# Patient Record
Sex: Female | Born: 1990 | Hispanic: Yes | Marital: Single | Smoking: Never smoker
Health system: Southern US, Community
[De-identification: ages and names within clinical notes are randomized; demographics above are authoritative.]

## PROBLEM LIST (undated history)

## (undated) DIAGNOSIS — G43909 Migraine, unspecified, not intractable, without status migrainosus: Secondary | ICD-10-CM

## (undated) DIAGNOSIS — E162 Hypoglycemia, unspecified: Secondary | ICD-10-CM

## (undated) HISTORY — PX: DENTAL SURGERY: SHX609

---
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
## 2014-01-05 ENCOUNTER — Emergency Department: Payer: Self-pay | Admitting: Emergency Medicine

## 2016-03-07 IMAGING — CR DG FOREARM 2V*L*
1 series · 1 of 1 positions shown · non-contrast
Comparison: None.

CLINICAL DATA: Dog bite.

EXAM:
LEFT FOREARM - 2 VIEW

[ap]
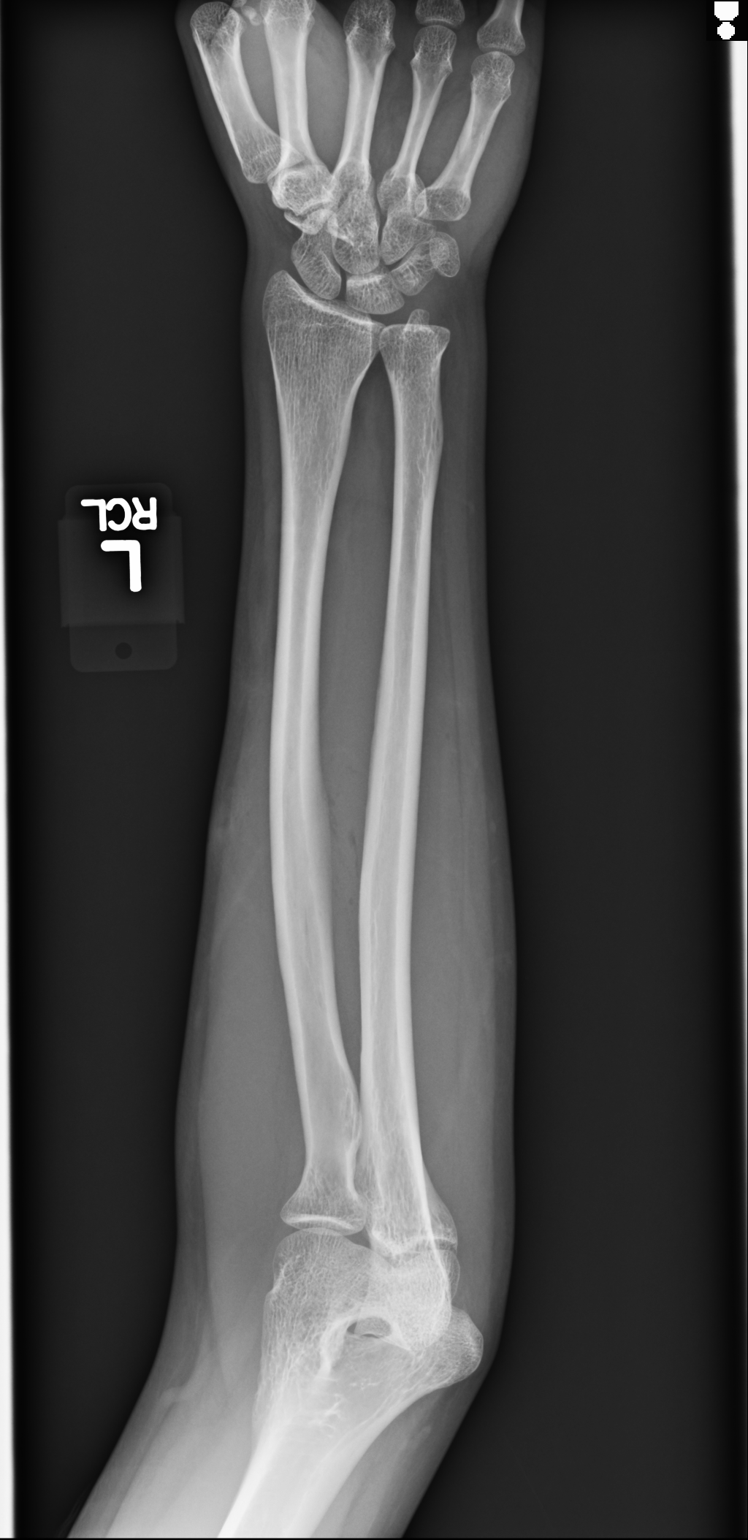

[1 of 1 positions shown; findings below may reference images not displayed]

FINDINGS: Small locules of air are seen in the soft tissues of the mid
forearm. No acute osseous abnormality or radiopaque foreign body.
IMPRESSION: Small amount of subcutaneous emphysema in the mid forearm without
acute osseous abnormality or radiopaque foreign body.

## 2018-11-27 ENCOUNTER — Other Ambulatory Visit: Payer: Self-pay

## 2018-11-27 ENCOUNTER — Encounter: Payer: Self-pay | Admitting: Emergency Medicine

## 2018-11-27 ENCOUNTER — Ambulatory Visit
Admission: EM | Admit: 2018-11-27 | Discharge: 2018-11-27 | Disposition: A | Discharge status: Home / Self Care | Payer: BC Managed Care – PPO | Attending: Family Medicine | Admitting: Family Medicine

## 2018-11-27 DIAGNOSIS — N3001 Acute cystitis with hematuria: Secondary | ICD-10-CM | POA: Diagnosis not present

## 2018-11-27 HISTORY — DX: Hypoglycemia, unspecified: E16.2

## 2018-11-27 HISTORY — DX: Migraine, unspecified, not intractable, without status migrainosus: G43.909

## 2018-11-27 LAB — URINALYSIS, COMPLETE (UACMP) WITH MICROSCOPIC
Bilirubin Urine: NEGATIVE
Glucose, UA: NEGATIVE mg/dL
Ketones, ur: NEGATIVE mg/dL
Nitrite: NEGATIVE
Protein, ur: NEGATIVE mg/dL
Specific Gravity, Urine: 1.02 (ref 1.005–1.030)
pH: 7 (ref 5.0–8.0)

## 2018-11-27 MED ORDER — CEPHALEXIN 500 MG PO CAPS: 500.0000 mg | ORAL_CAPSULE | Freq: Two times a day (BID) | ORAL | 0 refills | Status: DC

## 2018-11-27 NOTE — ED Triage Notes (Signed)
Patient in today c/o dysuria, abnormal odor and back pain x 3 days. No fever. No vaginal discharge.

## 2018-11-27 NOTE — Discharge Instructions (Signed)
Antibiotic as prescribed.  Take care  Dr. Lucinda Spells  

## 2018-11-27 NOTE — ED Provider Notes (Signed)
[facility]    CSN: [id number] Arrival date & time: 11/27/18  1724  History   Chief Complaint Chief Complaint  Patient presents with  . Dysuria   HPI  28 year old female presents with concerns for UTI.  Patient reports that her symptoms started 3 days ago.  She reports dysuria and urinary odor.  Associated suprapubic discomfort and back pain.  Reports urgency.  No fever.  No chills.  She states that she has had UTIs in the past.  No medications or interventions tried.  Denies vaginal discharge.  No known exacerbating or relieving factors.  No other complaints.  PMH, Surgical Hx, Family Hx, Social History reviewed and updated as below.  Past Medical History:  Diagnosis Date  . Hypoglycemia   . Migraine    Past Surgical History:  Procedure Laterality Date  . DENTAL SURGERY     OB History   No obstetric history on file.    Home Medications    Prior to Admission medications   Medication Sig Start Date End Date Taking? Authorizing Provider  cephALEXin (KEFLEX) 500 MG capsule Take 1 capsule (500 mg total) by mouth 2 (two) times daily. 11/27/18   Delle Andrzejewski G, DO  ESTARYLLA 0.25-35 MG-MCG tablet TK 1 T PO QD FOR BIRTH CONTROL. 08/25/18   [provider]    Family History Family History  Problem Relation Age of Onset  . Diabetes Mother   . Diabetes Father     Social History Social History   Tobacco Use  . Smoking status: Never Smoker  . Smokeless tobacco: Never Used  Substance Use Topics  . Alcohol use: Yes    Comment: social  . Drug use: Never     Allergies   Patient has no known allergies.   Review of Systems Review of Systems  Constitutional: Negative.   Gastrointestinal: Positive for abdominal pain.  Genitourinary: Positive for dysuria and urgency.  Musculoskeletal: Positive for back pain.   Physical Exam Triage Vital Signs ED Triage Vitals [11/27/18 1736]  Enc Vitals Group     BP 125/86     Pulse Rate 60     Resp 18   Temp 98.2 F (36.8 C)     Temp Source Oral     SpO2 100 %     Weight 130 lb (59 kg)     Height 5\' 2"  (1.575 m)     Head Circumference      Peak Flow      Pain Score 5     Pain Loc      Pain Edu?      Excl. in GC?    Updated Vital Signs BP 125/86 (BP Location: Left Arm)   Pulse 60   Temp 98.2 F (36.8 C) (Oral)   Resp 18   Ht 5\' 2"  (1.575 m)   Wt 59 kg   LMP 11/06/2018 (Approximate)   SpO2 100%   BMI 23.78 kg/m   Visual Acuity Right Eye Distance:   Left Eye Distance:   Bilateral Distance:    Right Eye Near:   Left Eye Near:    Bilateral Near:     Physical Exam Vitals signs and nursing note reviewed.  Constitutional:      General: She is not in acute distress.    Appearance: Normal appearance. She is not ill-appearing.  HENT:     Head: Normocephalic and atraumatic.  Eyes:     General:        Right eye: No  discharge.        Left eye: No discharge.     Conjunctiva/sclera: Conjunctivae normal.  Cardiovascular:     Rate and Rhythm: Normal rate and regular rhythm.     Heart sounds: No murmur.  Pulmonary:     Effort: Pulmonary effort is normal.     Breath sounds: Normal breath sounds. No wheezing, rhonchi or rales.  Abdominal:     General: There is no distension.     Palpations: Abdomen is soft.     Comments: Mild tenderness in the suprapubic region.  Neurological:     Mental Status: She is alert.  Psychiatric:        Mood and Affect: Mood normal.        Behavior: Behavior normal.    UC Treatments / Results  Labs (all labs ordered are listed, but only abnormal results are displayed) Labs Reviewed  URINALYSIS, COMPLETE (UACMP) WITH MICROSCOPIC - Abnormal; Notable for the following components:      Result Value   Hgb urine dipstick SMALL (*)    Leukocytes,Ua SMALL (*)    Bacteria, UA FEW (*)    All other components within normal limits  URINE CULTURE    EKG   Radiology No results found.  Procedures Procedures (including critical care time)   Medications Ordered in UC Medications - No data to display  Initial Impression / Assessment and Plan / UC Course  I have reviewed the triage vital signs and the nursing notes.  Pertinent labs & imaging results that were available during my care of the patient were reviewed by me and considered in my medical decision making (see chart for details).    28 year old female presents with urinary symptoms.  Urinalysis consistent with UTI.  Sending culture.  Placing on Keflex.  Final Clinical Impressions(s) / UC Diagnoses   Final diagnoses:  Acute cystitis with hematuria     Discharge Instructions     Antibiotic as prescribed.  Take care  Dr. Lacinda Axon    ED Prescriptions    Medication Sig Dispense Auth. Provider   cephALEXin (KEFLEX) 500 MG capsule Take 1 capsule (500 mg total) by mouth 2 (two) times daily. 14 capsule Thersa Salt G, DO     PDMP not reviewed this encounter.   Coral Spikes, [place] 11/27/18 1830

## 2018-11-29 LAB — URINE CULTURE: Culture: 40000 — AB

## 2018-12-01 ENCOUNTER — Telehealth (HOSPITAL_COMMUNITY): Payer: Self-pay | Admitting: Emergency Medicine

## 2018-12-01 NOTE — Telephone Encounter (Signed)
Urine culture was positive for ESCHERICHIA COLI  and was given keflex  at urgent care visit.Attempted to reach patient. No answer at this time.   

## 2019-03-20 ENCOUNTER — Other Ambulatory Visit: Payer: Self-pay

## 2019-03-20 ENCOUNTER — Encounter: Payer: Self-pay | Admitting: Emergency Medicine

## 2019-03-20 ENCOUNTER — Ambulatory Visit
Admission: EM | Admit: 2019-03-20 | Discharge: 2019-03-20 | Disposition: A | Discharge status: Home / Self Care | Payer: BC Managed Care – PPO | Attending: Urgent Care | Admitting: Urgent Care

## 2019-03-20 DIAGNOSIS — G43009 Migraine without aura, not intractable, without status migrainosus: Secondary | ICD-10-CM

## 2019-03-20 DIAGNOSIS — R519 Headache, unspecified: Secondary | ICD-10-CM

## 2019-03-20 MED ORDER — KETOROLAC TROMETHAMINE 60 MG/2ML IM SOLN
30.0000 mg | Freq: Once | INTRAMUSCULAR | Status: AC
Start: 2019-03-20 — End: 2019-03-20
  Administered 2019-03-20: 30 mg via INTRAMUSCULAR

## 2019-03-20 MED ORDER — DEXAMETHASONE SODIUM PHOSPHATE 10 MG/ML IJ SOLN
10.0000 mg | Freq: Once | INTRAMUSCULAR | Status: AC
  Administered 2019-03-20: 10 mg via INTRAMUSCULAR

## 2019-03-20 MED ORDER — BUTALBITAL-APAP-CAFFEINE 50-325-40 MG PO TABS: 1.0000 | ORAL_TABLET | Freq: Three times a day (TID) | ORAL | 0 refills | Status: DC | PRN

## 2019-03-20 NOTE — ED Triage Notes (Signed)
Patient describes her headache as on top of her head and behind her eyes that started today.  Patient reports history of migraines.  Patient reports light and sound sensitivity.  Patient took Tylenol today with no relief.

## 2019-03-20 NOTE — Discharge Instructions (Addendum)
It was very nice seeing you today in clinic. Thank you for entrusting me with your care.   Migraine likely associated with your menstrual cycle. Rest and make sure you are staying well hydrated. May use Tylenol and/or Ibuprofen as needed for headache. If pain not resolved, may use the prescribed intervention. Be careful as it can make you sleepy; no driving while taking this medication.   Make arrangements to follow up with your regular doctor in 1 week for re-evaluation if not improving. If your symptoms/condition worsens, please seek follow up care either here or in the ER. Please remember, our [facility] providers are "right here with you" when you need [place].   Again, it was my pleasure to take care of you today. Thank you for choosing our clinic. I hope that you start to feel better quickly.   Quentin Mulling, MSN, APRN, FNP-C, CEN Advanced Practice Provider  [facility]

## 2019-03-21 NOTE — ED Provider Notes (Signed)
[place], [place]   Name: Susan Hanna DOB: [birth year] MRN: [id number] CSN: [id number] PCP: [facility] [place] date and time:  03/20/19 1604  Chief Complaint:  Headache   NOTE: Prior to seeing the patient today, I have reviewed the triage nursing documentation and vital signs. Clinical staff has updated patient's PMH/PSHx, current medication list, and drug allergies/intolerances to ensure comprehensive history available to assist in medical decision making.   History:   HPI: Susan Hanna is a 29 y.o. female who presents today with complaints of a migraine headache that began with acute onset yesterday; worsened today. Patient has a PMH significant for migraines and notes that this headache is similar to her past episodes. She denies preceding aura or known triggers. No recent illnesses or upper respiratory symptoms. She has experienced associated nausea, vomiting, vertiginous symptoms, and photophobia. Last vomited yesterday, however nausea persists. Patient reporting a decreased appetite secondary to her symptoms, however she has been making a conscious effort to increase fluid intake in order to maintain adequate hydration. Patient denies focal weakness, nuchal rigidity, changes to her vision, AMS, ataxia, or difficulties with her speech. Patient advising today that she is not on any type of preventative or abortive therapies for the management of her migraine headaches. In efforts to conservatively manage her symptoms at home, the patient notes that she has used APAP, which has not helped to improve her symptoms.   Past Medical History:  Diagnosis Date  . Hypoglycemia   . Migraine     Past Surgical History:  Procedure Laterality Date  . DENTAL SURGERY      Family History  Problem Relation Age of Onset  . Diabetes Mother   . Diabetes Father     Social History   Tobacco Use  . Smoking status: Never Smoker  . Smokeless tobacco: Never  Used  Substance Use Topics  . Alcohol use: Yes    Comment: social  . Drug use: Never    There are no problems to display for this patient.   Home Medications:    Current Meds  Medication Sig  . ESTARYLLA 0.25-35 MG-MCG tablet TK 1 T PO QD FOR BIRTH CONTROL.    Allergies:   Patient has no known allergies.  Review of Systems (ROS): Review of Systems  Constitutional: Positive for appetite change. Negative for fatigue and fever.  HENT: Negative for congestion, ear pain, postnasal drip, rhinorrhea, sinus pressure, sinus pain, sneezing and sore throat.   Eyes: Positive for photophobia. Negative for pain, discharge, redness and visual disturbance.  Respiratory: Negative for cough, chest tightness and shortness of breath.   Cardiovascular: Negative for chest pain and palpitations.  Gastrointestinal: Positive for nausea and vomiting. Negative for abdominal pain and diarrhea.  Musculoskeletal: Negative for arthralgias, back pain, myalgias and neck pain.  Skin: Negative for color change, pallor and rash.  Neurological: Positive for dizziness and headaches. Negative for syncope and weakness.  Hematological: Negative for adenopathy.     Vital Signs: Today's Vitals   03/20/19 1628 03/20/19 1631 03/20/19 1730  BP:  124/83   Pulse:  63   Resp:  14   Temp:  98.4 F (36.9 C)   TempSrc:  Oral   SpO2:  100%   Weight: 133 lb (60.3 kg)    Height: 5\' 2"  (1.575 m)    PainSc: 9   9     Physical Exam: Physical Exam  Constitutional: She is oriented to person, place, and time and  well-developed, well-nourished, and in no distress.  HENT:  Head: Normocephalic and atraumatic.  Right Ear: Tympanic membrane normal.  Left Ear: Tympanic membrane normal.  Nose: Nose normal.  Mouth/Throat: Uvula is midline, oropharynx is clear and moist and mucous membranes are normal.  Eyes: Pupils are equal, round, and reactive to light. Conjunctivae and EOM are normal.  Neck: No Brudzinski's sign and no  Kernig's sign noted.  Cardiovascular: Normal rate, regular rhythm, normal heart sounds and intact distal pulses.  Pulmonary/Chest: Effort normal and breath sounds normal.  Musculoskeletal:        General: Normal range of motion.     Cervical back: Full passive range of motion without pain and neck supple.  Neurological: She is alert and oriented to person, place, and time. She has normal sensation, normal strength, normal reflexes and intact cranial nerves. Gait normal.  Skin: Skin is warm and dry. No rash noted. She is not diaphoretic.  Psychiatric: Mood, memory, affect and judgment normal.  Nursing note and vitals reviewed.   Urgent Care Treatments / Results:   No orders of the defined types were placed in this encounter.   LABS: PLEASE NOTE: all labs that were ordered this encounter are listed, however only abnormal results are displayed. Labs Reviewed - No data to display  EKG: -None  RADIOLOGY: No results found.  PROCEDURES: Procedures  MEDICATIONS RECEIVED THIS VISIT: Medications  ketorolac (TORADOL) injection 30 mg (30 mg Intramuscular Given 03/20/19 1658)  dexamethasone (DECADRON) injection 10 mg (10 mg Intramuscular Given 03/20/19 1658)    PERTINENT CLINICAL COURSE NOTES/UPDATES:   Initial Impression / Assessment and Plan / Urgent Care Course:  Pertinent labs & imaging results that were available during my care of the patient were personally reviewed by me and considered in my medical decision making (see lab/imaging section of note for values and interpretations).  Susan Hanna is a 29 y.o. female who presents to [facility] today with complaints of Headache  Patient is well appearing overall in clinic today. She does not appear to be in any acute distress. Presenting symptoms (see HPI) and exam as documented above. Symptoms consistent with classic migraine. Patient notes similar to previous headaches. Patient current on menstrual cycle, thus  etiology is favored to be hormonal. No focal neurological concerns. Nausea today, however she advises that symptoms controlled at time of clinic visit. Last vomited x 1 yesterday. Treated in clinic with IM ketorolac and dexamethasone, which improved her symptoms (pain down to 7/10). Patient advised that medications will continue to work over the next few hours. She feels well enough to proceed with discharge. Patient to use APAP and/or IBU as needed to recurrent headaches tonight. Will provide a short course of Fioricet 50/325/40 mg tablets for PRN use; indications and associated side effects reviewed. She does not feel like she needs an intervention in place for nausea at this point. Encouraged patient to return call to the clinic if not improving. Additionally, encouraged patient to discuss migraines with her PCP to determine if abortive therapy may be warranted for her.   Discussed follow up with primary care physician in 1 week, or sooner if needed, for re-evaluation. I have reviewed the follow up and strict return precautions for any new or worsening symptoms. Patient is aware of symptoms that would be deemed urgent/emergent, and would thus require further evaluation either here or in the emergency department. At the time of discharge, she verbalized understanding and consent with the discharge plan as it was  reviewed with her. All questions were fielded by provider and/or clinic staff prior to patient discharge.    Final Clinical Impressions / Urgent Care Diagnoses:   Final diagnoses:  Migraine without aura and without status migrainosus, not intractable    New Prescriptions:  [place] Controlled Substance Registry consulted? Not Applicable   . Discussed use of controlled substance medication to treat her acute pain.  o Reviewed  STOP Act regulations  o Clinic does not refill controlled substances over the phone without face to face evaluation.  . Safety precautions reviewed.  o Medications should  not be bitten, chewed, sold, or taken with alcohol.  o Avoid use while working, driving, or operating heavy machinery.  o Side effects associated with the use of this particular medication reviewed. - Patient understands that this medication can cause CNS depression, increase her risk of falls, and even lead to overdose that may result in death, if used outside of the parameters that she and I discussed.  With all of this in mind, she knowingly accepts the risks and responsibilities associated with intended course of treatment, and elects to responsibly proceed as discussed.  Meds ordered this encounter  Medications  . ketorolac (TORADOL) injection 30 mg  . dexamethasone (DECADRON) injection 10 mg  . butalbital-acetaminophen-caffeine (FIORICET) 50-325-40 MG tablet    Sig: Take 1 tablet by mouth every 8 (eight) hours as needed for headache.    Dispense:  15 tablet    Refill:  0    Recommended Follow up Care:  Patient encouraged to follow up with the following provider within the specified time frame, or sooner as dictated by the severity of her symptoms. As always, she was instructed that for any urgent/emergent care needs, she should seek care either here or in the emergency department for more immediate evaluation.  Follow-up Information    [facility] In 1 week.   Specialty: General Practice Why: General reassessment of symptoms if not improving Contact information: [address] [place] [address] [phone]         NOTE: This note was prepared using Dragon dictation software along with smaller phrase technology. Despite my best ability to proofread, there is the potential that transcriptional errors may still occur from this process, and are completely unintentional.    Verlee Monte, NP 03/21/19 2109

## 2019-09-27 ENCOUNTER — Ambulatory Visit
Admission: EM | Admit: 2019-09-27 | Discharge: 2019-09-27 | Disposition: A | Discharge status: Home / Self Care | Payer: BC Managed Care – PPO | Attending: Internal Medicine | Admitting: Internal Medicine

## 2019-09-27 ENCOUNTER — Other Ambulatory Visit: Payer: Self-pay

## 2019-09-27 ENCOUNTER — Encounter: Payer: Self-pay | Admitting: Emergency Medicine

## 2019-09-27 ENCOUNTER — Ambulatory Visit (INDEPENDENT_AMBULATORY_CARE_PROVIDER_SITE_OTHER): Payer: BC Managed Care – PPO

## 2019-09-27 DIAGNOSIS — S93602A Unspecified sprain of left foot, initial encounter: Secondary | ICD-10-CM

## 2019-09-27 MED ORDER — IBUPROFEN 600 MG PO TABS
600.0000 mg | ORAL_TABLET | Freq: Four times a day (QID) | ORAL | 0 refills | Status: DC | PRN
Start: 2019-09-27 — End: 2020-03-21

## 2019-09-27 NOTE — Discharge Instructions (Signed)
Gentle range of motion exercises Start bearing weight as tolerated Continue icing of the left foot. Return to urgent care if you have any worsening symptoms.

## 2019-09-27 NOTE — ED Triage Notes (Signed)
Patient c/o left ankle pain after rolling her ankle yesterday.  Patient states that she missed a step and fell.

## 2019-09-27 NOTE — ED Notes (Signed)
Ace wrap applied to right ankle.

## 2019-09-27 NOTE — ED Provider Notes (Signed)
[facility]    CSN: [id number] Arrival date & time: 09/27/19  0959      History   Chief Complaint Chief Complaint  Patient presents with  . Fall  . Ankle Pain    left    HPI Susan Hanna is a 29 y.o. female comes to the urgent care with complaints of left ankle pain which started after she rolled her ankle yesterday.  She was walking down the stairs when she rolled her ankle.  She has some swelling in the left foot near the ankle.  She has been icing it and has significant tenderness when she tries to bear weight.  No swelling of the ankle joint.  No bruising of the ankle.Marland Kitchen   HPI  Past Medical History:  Diagnosis Date  . Hypoglycemia   . Migraine     There are no problems to display for this patient.   Past Surgical History:  Procedure Laterality Date  . DENTAL SURGERY      OB History   No obstetric history on file.      Home Medications    Prior to Admission medications   Medication Sig Start Date End Date Taking? Authorizing Provider  ESTARYLLA 0.25-35 MG-MCG tablet TK 1 T PO QD FOR BIRTH CONTROL. 08/25/18  Yes [provider]  butalbital-acetaminophen-caffeine (FIORICET) 50-325-40 MG tablet Take 1 tablet by mouth every 8 (eight) hours as needed for headache. 03/20/19   Verlee Monte, NP  ibuprofen (ADVIL) 600 MG tablet Take 1 tablet (600 mg total) by mouth every 6 (six) hours as needed. 09/27/19   LampteyBritta Mccreedy, MD    Family History Family History  Problem Relation Age of Onset  . Diabetes Mother   . Diabetes Father     Social History Social History   Tobacco Use  . Smoking status: Never Smoker  . Smokeless tobacco: Never Used  Vaping Use  . Vaping Use: Never used  Substance Use Topics  . Alcohol use: Yes    Comment: social  . Drug use: Never     Allergies   Patient has no known allergies.   Review of Systems Review of Systems  HENT: Negative.   Respiratory: Negative.   Gastrointestinal: Negative.     Musculoskeletal: Positive for arthralgias and joint swelling.     Physical Exam Triage Vital Signs ED Triage Vitals  Enc Vitals Group     BP 09/27/19 1140 116/81     Pulse Rate 09/27/19 1140 75     Resp 09/27/19 1140 14     Temp 09/27/19 1140 98.6 F (37 C)     Temp Source 09/27/19 1140 Oral     SpO2 09/27/19 1140 99 %     Weight 09/27/19 1137 145 lb (65.8 kg)     Height 09/27/19 1137 5\' 2"  (1.575 m)     Head Circumference --      Peak Flow --      Pain Score 09/27/19 1137 8     Pain Loc --      Pain Edu? --      Excl. in GC? --    No data found.  Updated Vital Signs BP 116/81 (BP Location: Right Arm)   Pulse 75   Temp 98.6 F (37 C) (Oral)   Resp 14   Ht 5\' 2"  (1.575 m)   Wt 65.8 kg   LMP 09/06/2019 (Approximate)   SpO2 99%   BMI 26.52 kg/m   Visual Acuity Right  Eye Distance:   Left Eye Distance:   Bilateral Distance:    Right Eye Near:   Left Eye Near:    Bilateral Near:     Physical Exam Vitals and nursing note reviewed.  Constitutional:      Appearance: Normal appearance.  Cardiovascular:     Rate and Rhythm: Normal rate and regular rhythm.  Musculoskeletal:     Comments: Tenderness on palpation of the left first tarsal metatarsal joint.  Swelling with no bruising.  Skin:    General: Skin is warm and dry.  Neurological:     Mental Status: She is alert.      UC Treatments / Results  Labs (all labs ordered are listed, but only abnormal results are displayed) Labs Reviewed - No data to display  EKG   Radiology DG Ankle Complete Left  Result Date: 09/27/2019 CLINICAL DATA:  Ankle pain and swelling after fall. EXAM: LEFT ANKLE COMPLETE - 3+ VIEW COMPARISON:  None. FINDINGS: Normal anatomic alignment. No evidence for acute fracture or dislocation. Talar dome is intact. No significant soft tissue swelling. IMPRESSION: No acute osseous abnormality. Electronically Signed   By: Annia Belt M.D.   On: 09/27/2019 12:12    Procedures Procedures  (including critical care time)  Medications Ordered in UC Medications - No data to display  Initial Impression / Assessment and Plan / UC Course  I have reviewed the triage vital signs and the nursing notes.  Pertinent labs & imaging results that were available during my care of the patient were reviewed by me and considered in my medical decision making (see chart for details).     1.  Left foot sprain: X-ray of the left foot is negative for acute fracture Ankle brace Tylenol/Motrin as needed for pain Gentle range of motion exercises, continue icing the joint, elevation of the joint with no walking Work excuse given Return precautions given. Final Clinical Impressions(s) / UC Diagnoses   Final diagnoses:  Foot sprain, left, initial encounter     Discharge Instructions     Gentle range of motion exercises Start bearing weight as tolerated Continue icing of the left foot. Return to urgent care if you have any worsening symptoms.   ED Prescriptions    Medication Sig Dispense Auth. Provider   ibuprofen (ADVIL) 600 MG tablet Take 1 tablet (600 mg total) by mouth every 6 (six) hours as needed. 30 tablet Susan Hanna, Susan Mccreedy, MD     PDMP not reviewed this encounter.   Merrilee Jansky, MD 09/27/19 1300

## 2019-11-10 ENCOUNTER — Other Ambulatory Visit: Payer: Self-pay

## 2019-11-10 ENCOUNTER — Ambulatory Visit
Admission: EM | Admit: 2019-11-10 | Discharge: 2019-11-10 | Disposition: A | Discharge status: Home / Self Care | Payer: BC Managed Care – PPO | Attending: Emergency Medicine | Admitting: Emergency Medicine

## 2019-11-10 ENCOUNTER — Encounter: Payer: Self-pay | Admitting: Emergency Medicine

## 2019-11-10 DIAGNOSIS — R112 Nausea with vomiting, unspecified: Secondary | ICD-10-CM | POA: Insufficient documentation

## 2019-11-10 DIAGNOSIS — Z3201 Encounter for pregnancy test, result positive: Secondary | ICD-10-CM | POA: Insufficient documentation

## 2019-11-10 LAB — PREGNANCY, URINE: Preg Test, Ur: POSITIVE — AB

## 2019-11-10 MED ORDER — DOXYLAMINE SUCCINATE (SLEEP) 25 MG PO TABS: ORAL_TABLET | ORAL | 2 refills | Status: DC

## 2019-11-10 MED ORDER — VITAMIN B-6 25 MG PO TABS: 25.0000 mg | ORAL_TABLET | ORAL | 1 refills | Status: AC | PRN

## 2019-11-10 NOTE — Discharge Instructions (Addendum)
The pregnancy test is positive today.  Follow-up with your OB GYN.  If you need anything for headaches you can take Tylenol only.  I have prescribed 2 different medications for nausea and vomiting.  Try to eat smaller meals and stay hydrated.  For any severe vomiting that is causing you to be weak go to the emergency room.  Also start prenatal vitamin.

## 2019-11-10 NOTE — ED Provider Notes (Signed)
[facility]    CSN: [id number] Arrival date & time: 11/10/19  1855      History   Chief Complaint Chief Complaint  Patient presents with  . Emesis    HPI Susan Hanna is a 29 y.o. female presenting for 4-day history of vomiting and nausea.  She says that she has about 3 episodes of vomiting a day.  She said nothing really brings it on, she just feels like something is stuck in her throat and then she vomits.  She says she is able to hold food down.  Her biggest concern is that she could possibly be pregnant.  Her last menstrual period only lasted for a day and that was 4 days ago.  She says she normally has periods that last longer than that.  She did stop taking her oral contraceptive 2 weeks ago.  Patient denies fever, fatigue, abdominal pain, back pain, dysuria, hematuria, vaginal discharge, concern for STIs.  Patient denies known Covid exposure but has not been vaccinated.  She denies any cough, congestion, sore throat, breathing difficulty.  No other concerns today.  HPI  Past Medical History:  Diagnosis Date  . Hypoglycemia   . Migraine     There are no problems to display for this patient.   Past Surgical History:  Procedure Laterality Date  . DENTAL SURGERY      OB History   No obstetric history on file.      Home Medications    Prior to Admission medications   Medication Sig Start Date End Date Taking? Authorizing Provider  butalbital-acetaminophen-caffeine (FIORICET) 50-325-40 MG tablet Take 1 tablet by mouth every 8 (eight) hours as needed for headache. 03/20/19  Yes Verlee Monte, NP  doxylamine, Sleep, (UNISOM) 25 MG tablet Take 1/2 tab PO q6h prn nausea/vomiting pregnancy related 11/10/19   Eusebio Friendly B, PA-C  ESTARYLLA 0.25-35 MG-MCG tablet TK 1 T PO QD FOR BIRTH CONTROL. 08/25/18   [provider]  ibuprofen (ADVIL) 600 MG tablet Take 1 tablet (600 mg total) by mouth every 6 (six) hours as needed. 09/27/19   Lamptey, Britta Mccreedy, MD  vitamin B-6 (PYRIDOXINE) 25 MG tablet Take 1 tablet (25 mg total) by mouth every 4 (four) hours as needed (nausea/vomiting). 11/10/19 12/10/19  Shirlee Latch, PA-C    Family History Family History  Problem Relation Age of Onset  . Diabetes Mother   . Diabetes Father     Social History Social History   Tobacco Use  . Smoking status: Never Smoker  . Smokeless tobacco: Never Used  Vaping Use  . Vaping Use: Never used  Substance Use Topics  . Alcohol use: Yes    Comment: social  . Drug use: Never     Allergies   Patient has no known allergies.   Review of Systems Review of Systems  Constitutional: Negative for chills, diaphoresis, fatigue and fever.  HENT: Negative for congestion, ear pain, rhinorrhea, sinus pressure, sinus pain and sore throat.   Respiratory: Negative for cough and shortness of breath.   Gastrointestinal: Positive for nausea and vomiting. Negative for abdominal pain.  Genitourinary: Negative for difficulty urinating, dysuria, frequency, pelvic pain and vaginal discharge.  Musculoskeletal: Negative for arthralgias and myalgias.  Skin: Negative for rash.  Neurological: Positive for headaches. Negative for weakness.  Hematological: Negative for adenopathy.     Physical Exam Triage Vital Signs ED Triage Vitals  Enc Vitals Group     BP 11/10/19 1947 113/77  Pulse Rate 11/10/19 1947 70     Resp 11/10/19 1947 18     Temp 11/10/19 1947 98.6 F (37 C)     Temp Source 11/10/19 1947 Oral     SpO2 11/10/19 1947 100 %     Weight 11/10/19 1946 145 lb 1 oz (65.8 kg)     Height 11/10/19 1946 5\' 2"  (1.575 m)     Head Circumference --      Peak Flow --      Pain Score 11/10/19 1945 0     Pain Loc --      Pain Edu? --      Excl. in GC? --    No data found.  Updated Vital Signs BP 113/77 (BP Location: Right Arm)   Pulse 70   Temp 98.6 F (37 C) (Oral)   Resp 18   Ht 5\' 2"  (1.575 m)   Wt 145 lb 1 oz (65.8 kg)   LMP 11/06/2019   SpO2 100%    BMI 26.53 kg/m        Physical Exam Vitals and nursing note reviewed.  Constitutional:      General: She is not in acute distress.    Appearance: Normal appearance. She is not ill-appearing or toxic-appearing.  HENT:     Head: Normocephalic and atraumatic.     Nose: Nose normal.     Mouth/Throat:     Mouth: Mucous membranes are moist.     Pharynx: Oropharynx is clear.  Eyes:     General: No scleral icterus.       Right eye: No discharge.        Left eye: No discharge.     Conjunctiva/sclera: Conjunctivae normal.  Cardiovascular:     Rate and Rhythm: Normal rate and regular rhythm.     Heart sounds: Normal heart sounds.  Pulmonary:     Effort: Pulmonary effort is normal. No respiratory distress.     Breath sounds: Normal breath sounds.  Abdominal:     Palpations: Abdomen is soft.     Tenderness: There is abdominal tenderness (mild umbilical ). There is no right CVA tenderness, left CVA tenderness or guarding.  Musculoskeletal:     Cervical back: Neck supple.  Skin:    General: Skin is dry.  Neurological:     General: No focal deficit present.     Mental Status: She is alert. Mental status is at baseline.     Motor: No weakness.     Gait: Gait normal.  Psychiatric:        Mood and Affect: Mood normal.        Behavior: Behavior normal.        Thought Content: Thought content normal.      UC Treatments / Results  Labs (all labs ordered are listed, but only abnormal results are displayed) Labs Reviewed  PREGNANCY, URINE - Abnormal; Notable for the following components:      Result Value   Preg Test, Ur POSITIVE (*)    All other components within normal limits    EKG   Radiology No results found.  Procedures Procedures (including critical care time)  Medications Ordered in UC Medications - No data to display  Initial Impression / Assessment and Plan / UC Course  I have reviewed the triage vital signs and the nursing notes.  Pertinent labs & imaging  results that were available during my care of the patient were reviewed by me and considered in my medical decision  making (see chart for details).   Patient pregnancy test positive today.  Discussed this with patient and advised her to follow-up with OB/GYN soon as possible.  Advised her to start prenatal vitamin at this time.  Treating nausea and vomiting with B6 and doxylamine.  Insurance does not cover Diclegis, so I prescribed them separately.  Also advised to eat smaller meals and increase fluid intake.  Advised her to go to emergency department for any severe intractable nausea and vomiting.  Discussed with patient that for headache she can really only take Tylenol.  Also advised her of other medications that are unsafe to take during pregnancy including NSAIDs.  Advised her to check all labels before taking any medication during pregnancy.  Final Clinical Impressions(s) / UC Diagnoses   Final diagnoses:  Positive pregnancy test  Non-intractable vomiting with nausea, unspecified vomiting type     Discharge Instructions     The pregnancy test is positive today.  Follow-up with your OB GYN.  If you need anything for headaches you can take Tylenol only.  I have prescribed 2 different medications for nausea and vomiting.  Try to eat smaller meals and stay hydrated.  For any severe vomiting that is causing you to be weak go to the emergency room.  Also start prenatal vitamin.    ED Prescriptions    Medication Sig Dispense Auth. Provider   vitamin B-6 (PYRIDOXINE) 25 MG tablet Take 1 tablet (25 mg total) by mouth every 4 (four) hours as needed (nausea/vomiting). 60 tablet Eusebio Friendly B, PA-C   doxylamine, Sleep, (UNISOM) 25 MG tablet Take 1/2 tab PO q6h prn nausea/vomiting pregnancy related 30 tablet Shirlee Latch, PA-C     PDMP not reviewed this encounter.   Shirlee Latch, PA-C 11/11/19 1523

## 2019-11-10 NOTE — ED Triage Notes (Signed)
Patient c/o vomiting x 4 days. Patient states she took 2 pregnancy tests that were negative. She is concerned she could possibly be pregnant.

## 2020-02-10 LAB — OB RESULTS CONSOLE HEPATITIS B SURFACE ANTIGEN: Hepatitis B Surface Ag: NEGATIVE

## 2020-02-10 LAB — OB RESULTS CONSOLE HIV ANTIBODY (ROUTINE TESTING): HIV: NONREACTIVE

## 2020-02-10 LAB — OB RESULTS CONSOLE RUBELLA ANTIBODY, IGM: Rubella: IMMUNE

## 2020-02-10 LAB — OB RESULTS CONSOLE VARICELLA ZOSTER ANTIBODY, IGG: Varicella: IMMUNE

## 2020-02-13 NOTE — L&D Delivery Note (Signed)
Delivery Note  Date of delivery: 07/04/2020 Estimated Date of Delivery: 07/14/20 Patient's last menstrual period was 10/08/2019 (exact date). EGA: [redacted]w[redacted]d  Delivery Note At 9:39 AM a viable female was delivered via Vaginal, Spontaneous (Presentation: OA).  APGAR: 8, 9; weight 6 lb 11.6 oz (3050 g). Placenta status: Spontaneous, Intact.  Cord: 3 vessels with the following complications: none  First Stage: Labor onset: unknown Augmentation : Pitocin Analgesia /Anesthesia intrapartum: none AROM at 0430  Susan Hanna presented to L&D with active labor.  After she reached 10 cm contractions spaced out to q30-45 min. She was augmented with pitocin.   Second Stage: Complete dilation at 0753 Onset of pushing at 0802 FHR second stage Cat I and Cat II Delivery at 0939 on 07/04/2020  She progressed to complete and had a spontaneous vaginal birth of a live female over an intact perineum. The fetal head was delivered in OA position with restitution to LOA. No nuchal cord. Anterior then posterior shoulders delivered spontaneously. Baby placed on mom's abdomen and attended to by transition RN. Cord quickly clamped and cut by FOB and baby taken to warmer to suction. Cord blood obtained for newborn labs.  Third Stage: Placenta delivered intact with 3VC at 0949 Placenta disposition: Pathology Right after delivery uterine atony - IV pitocin given for hemorrhage prophylaxis right after placenta, bleeding heavy and uterus bogyy, Cytotec given PR followed by methergine 0.2mg  IM.  Uterus firmed up with massage.TXA administered. Uterine tone firm / bleeding min when I left the room   Anesthesia: None Episiotomy: None Lacerations:  none Suture Repair: n/a Est. Blood Loss (mL):   Complications: Thick mec, Immediate PPH  Mom to postpartum.  Baby to Couplet care / Skin to Skin.  Newborn: Birth Weight:  6 lb 11.6 oz (3050 g)  Apgar Scores: 8, 9 Feeding planned: Both   Cyril Mourning, [place] 07/04/2020 10:27 AM

## 2020-03-10 ENCOUNTER — Other Ambulatory Visit: Payer: Self-pay | Admitting: Family Medicine

## 2020-03-10 DIAGNOSIS — Z3402 Encounter for supervision of normal first pregnancy, second trimester: Secondary | ICD-10-CM

## 2020-03-21 ENCOUNTER — Ambulatory Visit
Admission: EM | Admit: 2020-03-21 | Discharge: 2020-03-21 | Disposition: A | Discharge status: Home / Self Care | Payer: Medicaid Other | Attending: Family Medicine | Admitting: Family Medicine

## 2020-03-21 ENCOUNTER — Other Ambulatory Visit: Payer: Self-pay

## 2020-03-21 DIAGNOSIS — M545 Low back pain, unspecified: Secondary | ICD-10-CM | POA: Diagnosis present

## 2020-03-21 LAB — URINALYSIS, COMPLETE (UACMP) WITH MICROSCOPIC
Bilirubin Urine: NEGATIVE
Glucose, UA: NEGATIVE mg/dL
Ketones, ur: NEGATIVE mg/dL
Leukocytes,Ua: NEGATIVE
Nitrite: NEGATIVE
Protein, ur: NEGATIVE mg/dL
Specific Gravity, Urine: 1.015 (ref 1.005–1.030)
pH: 7 (ref 5.0–8.0)

## 2020-03-21 NOTE — Discharge Instructions (Signed)
No evidence of UTI.  Follow up with OBGYN  Take care  Dr. Adriana Simas

## 2020-03-21 NOTE — ED Triage Notes (Signed)
Pt with low back pain and low abdominal pain starting on Saturday. "That's usually what happens when I have a UTI"

## 2020-03-22 NOTE — ED Provider Notes (Signed)
[facility]    CSN: [id number] Arrival date & time: 03/21/20  1943      History   Chief Complaint Chief Complaint  Patient presents with  . low back pain   HPI  30 year old female presents with back pain and abdominal pain.  Patient currently pregnant. Reports lower abdominal pain and low back pain since Saturday. No urinary symptoms. Good fetal movement. No contractions. No leaking of fluid. No vaginal bleeding. She is concerned that she may have a UTI. No relieving factors. No other complaints.    Past Medical History:  Diagnosis Date  . Hypoglycemia   . Migraine    Past Surgical History:  Procedure Laterality Date  . DENTAL SURGERY      OB History    Gravida  1   Para      Term      Preterm      AB      Living        SAB      IAB      Ectopic      Multiple      Live Births               Home Medications    Prior to Admission medications   Medication Sig Start Date End Date Taking? Authorizing Provider  Pyridoxine HCl (VITAMIN B6) 50 MG TABS Take by mouth. 11/11/19   [provider]  doxylamine, Sleep, (UNISOM) 25 MG tablet Take 1/2 tab PO q6h prn nausea/vomiting pregnancy related 11/10/19 03/21/20  Eusebio Friendly B, PA-C  ESTARYLLA 0.25-35 MG-MCG tablet TK 1 T PO QD FOR BIRTH CONTROL. 08/25/18 03/21/20  [provider]    Family History Family History  Problem Relation Age of Onset  . Diabetes Mother   . Diabetes Father     Social History Social History   Tobacco Use  . Smoking status: Never Smoker  . Smokeless tobacco: Never Used  Vaping Use  . Vaping Use: Never used  Substance Use Topics  . Alcohol use: Yes    Comment: social  . Drug use: Never     Allergies   Patient has no known allergies.   Review of Systems Review of Systems  Gastrointestinal: Positive for abdominal pain.  Musculoskeletal: Positive for back pain.   Physical Exam Triage Vital Signs ED Triage Vitals  Enc Vitals Group      BP 03/21/20 1953 119/74     Pulse Rate 03/21/20 1953 80     Resp 03/21/20 1953 15     Temp 03/21/20 1953 98 F (36.7 C)     Temp Source 03/21/20 1953 Oral     SpO2 03/21/20 1953 99 %     Weight --      Height --      Head Circumference --      Peak Flow --      Pain Score 03/21/20 1951 5     Pain Loc --      Pain Edu? --      Excl. in GC? --    Updated Vital Signs BP 119/74 (BP Location: Left Arm)   Pulse 80   Temp 98 F (36.7 C) (Oral)   Resp 15   LMP 10/08/2019   SpO2 99%   Visual Acuity Right Eye Distance:   Left Eye Distance:   Bilateral Distance:    Right Eye Near:   Left Eye Near:    Bilateral Near:  Physical Exam Vitals and nursing note reviewed.  Constitutional:      General: She is not in acute distress.    Appearance: Normal appearance. She is not ill-appearing.  HENT:     Head: Normocephalic and atraumatic.  Eyes:     General:        Right eye: No discharge.        Left eye: No discharge.     Conjunctiva/sclera: Conjunctivae normal.  Cardiovascular:     Rate and Rhythm: Normal rate and regular rhythm.  Pulmonary:     Effort: Pulmonary effort is normal.     Breath sounds: Normal breath sounds. No wheezing or rales.  Neurological:     Mental Status: She is alert.    UC Treatments / Results  Labs (all labs ordered are listed, but only abnormal results are displayed) Labs Reviewed  URINALYSIS, COMPLETE (UACMP) WITH MICROSCOPIC - Abnormal; Notable for the following components:      Result Value   Hgb urine dipstick TRACE (*)    Bacteria, UA FEW (*)    All other components within normal limits  URINE CULTURE    EKG   Radiology No results found.  Procedures Procedures (including critical care time)  Medications Ordered in UC Medications - No data to display  Initial Impression / Assessment and Plan / UC Course  I have reviewed the triage vital signs and the nursing notes.  Pertinent labs & imaging results that were  available during my care of the patient were reviewed by me and considered in my medical decision making (see chart for details).    30 year old female presents with low back pain and lower abdominal pain. No evidence of UTI. Normal fetal heart tones. Advised symptomatic treatment with tylenol. Follow up with OB.  Final Clinical Impressions(s) / UC Diagnoses   Final diagnoses:  Acute low back pain without sciatica, unspecified back pain laterality     Discharge Instructions     No evidence of UTI.  Follow up with OBGYN  Take care  Dr. Adriana Simas    ED Prescriptions    None     PDMP not reviewed this encounter.   Tommie Sams, [place] 03/22/20 [phone]

## 2020-03-23 LAB — URINE CULTURE: Culture: 40000 — AB

## 2020-04-07 LAB — OB RESULTS CONSOLE RPR
RPR: NONREACTIVE
RPR: NONREACTIVE

## 2020-04-20 DIAGNOSIS — N879 Dysplasia of cervix uteri, unspecified: Secondary | ICD-10-CM | POA: Insufficient documentation

## 2020-06-24 LAB — OB RESULTS CONSOLE GC/CHLAMYDIA
Chlamydia: NEGATIVE
Gonorrhea: NEGATIVE

## 2020-06-24 LAB — OB RESULTS CONSOLE GBS: GBS: NEGATIVE

## 2020-07-04 ENCOUNTER — Encounter: Payer: Self-pay | Admitting: Obstetrics and Gynecology

## 2020-07-04 ENCOUNTER — Other Ambulatory Visit: Payer: Self-pay

## 2020-07-04 ENCOUNTER — Inpatient Hospital Stay
Admission: EM | Admit: 2020-07-04 | Discharge: 2020-07-05 | DRG: 806 | Disposition: A | Discharge status: Home / Self Care | Payer: Medicaid Other | Attending: Certified Nurse Midwife | Admitting: Certified Nurse Midwife

## 2020-07-04 DIAGNOSIS — O9081 Anemia of the puerperium: Secondary | ICD-10-CM | POA: Diagnosis not present

## 2020-07-04 DIAGNOSIS — N879 Dysplasia of cervix uteri, unspecified: Secondary | ICD-10-CM | POA: Diagnosis present

## 2020-07-04 DIAGNOSIS — D62 Acute posthemorrhagic anemia: Secondary | ICD-10-CM | POA: Diagnosis not present

## 2020-07-04 DIAGNOSIS — O99892 Other specified diseases and conditions complicating childbirth: Secondary | ICD-10-CM | POA: Diagnosis present

## 2020-07-04 DIAGNOSIS — O4292 Full-term premature rupture of membranes, unspecified as to length of time between rupture and onset of labor: Principal | ICD-10-CM | POA: Diagnosis present

## 2020-07-04 DIAGNOSIS — Z3A38 38 weeks gestation of pregnancy: Secondary | ICD-10-CM

## 2020-07-04 DIAGNOSIS — O26893 Other specified pregnancy related conditions, third trimester: Secondary | ICD-10-CM | POA: Diagnosis present

## 2020-07-04 DIAGNOSIS — Z20822 Contact with and (suspected) exposure to covid-19: Secondary | ICD-10-CM | POA: Diagnosis present

## 2020-07-04 LAB — ABO/RH: ABO/RH(D): O POS

## 2020-07-04 LAB — CBC
HCT: 39.8 % (ref 36.0–46.0)
Hemoglobin: 13.6 g/dL (ref 12.0–15.0)
MCH: 31.4 pg (ref 26.0–34.0)
MCHC: 34.2 g/dL (ref 30.0–36.0)
MCV: 91.9 fL (ref 80.0–100.0)
Platelets: 271 10*3/uL (ref 150–400)
RBC: 4.33 MIL/uL (ref 3.87–5.11)
RDW: 14.4 % (ref 11.5–15.5)
WBC: 13.1 10*3/uL — ABNORMAL HIGH (ref 4.0–10.5)
nRBC: 0 % (ref 0.0–0.2)

## 2020-07-04 LAB — RESP PANEL BY RT-PCR (FLU A&B, COVID) ARPGX2
Influenza A by PCR: NEGATIVE
Influenza B by PCR: NEGATIVE
SARS Coronavirus 2 by RT PCR: NEGATIVE

## 2020-07-04 LAB — TYPE AND SCREEN
ABO/RH(D): O POS
Antibody Screen: NEGATIVE

## 2020-07-04 LAB — RPR: RPR Ser Ql: NONREACTIVE

## 2020-07-04 MED ORDER — BENZOCAINE-MENTHOL 20-0.5 % EX AERO
1.0000 "application " | INHALATION_SPRAY | CUTANEOUS | Status: DC | PRN
  Filled 2020-07-04: qty 56

## 2020-07-04 MED ORDER — OXYCODONE HCL 5 MG PO TABS: 10.0000 mg | ORAL_TABLET | ORAL | Status: DC | PRN

## 2020-07-04 MED ORDER — DOCUSATE SODIUM 100 MG PO CAPS
100.0000 mg | ORAL_CAPSULE | Freq: Two times a day (BID) | ORAL | Status: DC
  Administered 2020-07-05: 100 mg via ORAL
  Filled 2020-07-04: qty 1

## 2020-07-04 MED ORDER — LIDOCAINE HCL (PF) 1 % IJ SOLN: 30.0000 mL | INTRAMUSCULAR | Status: DC | PRN

## 2020-07-04 MED ORDER — LIDOCAINE HCL (PF) 1 % IJ SOLN
INTRAMUSCULAR | Status: AC
  Filled 2020-07-04: qty 30

## 2020-07-04 MED ORDER — TRANEXAMIC ACID-NACL 1000-0.7 MG/100ML-% IV SOLN
INTRAVENOUS | Status: AC
  Filled 2020-07-04: qty 100

## 2020-07-04 MED ORDER — MISOPROSTOL 200 MCG PO TABS
ORAL_TABLET | ORAL | Status: AC
  Filled 2020-07-04: qty 4

## 2020-07-04 MED ORDER — SIMETHICONE 80 MG PO CHEW: 80.0000 mg | CHEWABLE_TABLET | ORAL | Status: DC | PRN

## 2020-07-04 MED ORDER — ONDANSETRON HCL 4 MG PO TABS
4.0000 mg | ORAL_TABLET | ORAL | Status: DC | PRN
  Filled 2020-07-04: qty 1

## 2020-07-04 MED ORDER — TRANEXAMIC ACID-NACL 1000-0.7 MG/100ML-% IV SOLN
1000.0000 mg | Freq: Once | INTRAVENOUS | Status: AC
  Administered 2020-07-04: 1000 mg via INTRAVENOUS

## 2020-07-04 MED ORDER — ACETAMINOPHEN 325 MG PO TABS
650.0000 mg | ORAL_TABLET | ORAL | Status: DC | PRN
  Administered 2020-07-04: 650 mg via ORAL
  Filled 2020-07-04 (×2): qty 2

## 2020-07-04 MED ORDER — LACTATED RINGERS IV SOLN: 500.0000 mL | INTRAVENOUS | Status: DC | PRN

## 2020-07-04 MED ORDER — AMMONIA AROMATIC IN INHA
RESPIRATORY_TRACT | Status: AC
  Filled 2020-07-04: qty 10

## 2020-07-04 MED ORDER — DIBUCAINE (PERIANAL) 1 % EX OINT
1.0000 "application " | TOPICAL_OINTMENT | CUTANEOUS | Status: DC | PRN
  Filled 2020-07-04: qty 28

## 2020-07-04 MED ORDER — TRANEXAMIC ACID 1000 MG/10ML IV SOLN
1000.0000 mg | Freq: Once | INTRAVENOUS | Status: DC
  Filled 2020-07-04: qty 10

## 2020-07-04 MED ORDER — MISOPROSTOL 200 MCG PO TABS
ORAL_TABLET | ORAL | Status: AC
  Administered 2020-07-04: 800 ug via RECTAL
  Filled 2020-07-04: qty 2

## 2020-07-04 MED ORDER — CALCIUM CARBONATE ANTACID 500 MG PO CHEW: 400.0000 mg | CHEWABLE_TABLET | Freq: Three times a day (TID) | ORAL | Status: DC | PRN

## 2020-07-04 MED ORDER — FERROUS SULFATE 325 (65 FE) MG PO TABS
325.0000 mg | ORAL_TABLET | Freq: Two times a day (BID) | ORAL | Status: DC
  Administered 2020-07-04 – 2020-07-05 (×2): 325 mg via ORAL
  Filled 2020-07-04 (×2): qty 1

## 2020-07-04 MED ORDER — DIPHENHYDRAMINE HCL 25 MG PO CAPS: 25.0000 mg | ORAL_CAPSULE | Freq: Four times a day (QID) | ORAL | Status: DC | PRN

## 2020-07-04 MED ORDER — IBUPROFEN 600 MG PO TABS
600.0000 mg | ORAL_TABLET | Freq: Four times a day (QID) | ORAL | Status: DC
  Administered 2020-07-04: 600 mg via ORAL
  Filled 2020-07-04: qty 1

## 2020-07-04 MED ORDER — LACTATED RINGERS IV SOLN: INTRAVENOUS | Status: DC

## 2020-07-04 MED ORDER — ONDANSETRON HCL 4 MG/2ML IJ SOLN: 4.0000 mg | INTRAMUSCULAR | Status: DC | PRN

## 2020-07-04 MED ORDER — ACETAMINOPHEN 500 MG PO TABS: 1000.0000 mg | ORAL_TABLET | Freq: Four times a day (QID) | ORAL | Status: DC | PRN

## 2020-07-04 MED ORDER — OXYTOCIN 10 UNIT/ML IJ SOLN
INTRAMUSCULAR | Status: AC
  Filled 2020-07-04: qty 2

## 2020-07-04 MED ORDER — SOD CITRATE-CITRIC ACID 500-334 MG/5ML PO SOLN: 30.0000 mL | ORAL | Status: DC | PRN

## 2020-07-04 MED ORDER — IBUPROFEN 600 MG PO TABS
600.0000 mg | ORAL_TABLET | Freq: Four times a day (QID) | ORAL | Status: DC
  Administered 2020-07-04 – 2020-07-05 (×3): 600 mg via ORAL
  Filled 2020-07-04 (×3): qty 1

## 2020-07-04 MED ORDER — METHYLERGONOVINE MALEATE 0.2 MG/ML IJ SOLN: 0.2000 mg | Freq: Once | INTRAMUSCULAR | Status: AC

## 2020-07-04 MED ORDER — COCONUT OIL OIL: 1.0000 "application " | TOPICAL_OIL | Status: DC | PRN

## 2020-07-04 MED ORDER — METHYLERGONOVINE MALEATE 0.2 MG/ML IJ SOLN
INTRAMUSCULAR | Status: AC
  Administered 2020-07-04: 0.2 mg via INTRAMUSCULAR
  Filled 2020-07-04: qty 1

## 2020-07-04 MED ORDER — FENTANYL CITRATE (PF) 100 MCG/2ML IJ SOLN: 50.0000 ug | INTRAMUSCULAR | Status: DC | PRN

## 2020-07-04 MED ORDER — ONDANSETRON HCL 4 MG/2ML IJ SOLN: 4.0000 mg | Freq: Four times a day (QID) | INTRAMUSCULAR | Status: DC | PRN

## 2020-07-04 MED ORDER — MISOPROSTOL 200 MCG PO TABS: 800.0000 ug | ORAL_TABLET | Freq: Once | ORAL | Status: AC

## 2020-07-04 MED ORDER — PRENATAL MULTIVITAMIN CH
1.0000 | ORAL_TABLET | Freq: Every day | ORAL | Status: DC
  Administered 2020-07-04 – 2020-07-05 (×2): 1 via ORAL
  Filled 2020-07-04 (×2): qty 1

## 2020-07-04 MED ORDER — METHYLERGONOVINE MALEATE 0.2 MG PO TABS
0.2000 mg | ORAL_TABLET | ORAL | Status: DC | PRN
  Filled 2020-07-04: qty 1

## 2020-07-04 MED ORDER — TETANUS-DIPHTH-ACELL PERTUSSIS 5-2.5-18.5 LF-MCG/0.5 IM SUSY: 0.5000 mL | PREFILLED_SYRINGE | Freq: Once | INTRAMUSCULAR | Status: DC

## 2020-07-04 MED ORDER — OXYTOCIN-SODIUM CHLORIDE 30-0.9 UT/500ML-% IV SOLN
2.5000 [IU]/h | INTRAVENOUS | Status: DC
  Administered 2020-07-04: 2.5 [IU]/h via INTRAVENOUS
  Filled 2020-07-04: qty 1000

## 2020-07-04 MED ORDER — OXYCODONE HCL 5 MG PO TABS
5.0000 mg | ORAL_TABLET | ORAL | Status: DC | PRN
  Administered 2020-07-04: 5 mg via ORAL
  Filled 2020-07-04: qty 1

## 2020-07-04 MED ORDER — METHYLERGONOVINE MALEATE 0.2 MG/ML IJ SOLN: 0.2000 mg | INTRAMUSCULAR | Status: DC | PRN

## 2020-07-04 MED ORDER — WITCH HAZEL-GLYCERIN EX PADS
1.0000 "application " | MEDICATED_PAD | CUTANEOUS | Status: DC | PRN
  Filled 2020-07-04: qty 100

## 2020-07-04 MED ORDER — OXYTOCIN BOLUS FROM INFUSION
333.0000 mL | Freq: Once | INTRAVENOUS | Status: DC
  Administered 2020-07-04: 333 mL via INTRAVENOUS

## 2020-07-04 NOTE — H&P (Signed)
OB History & Physical   History of Present Illness:   Chief Complaint: contractions and LOF  HPI:  Susan Hanna is a 30 y.o. G1P0 female at [redacted]w[redacted]d dated by LMP of 10/06/2020, c/w [place] at [redacted]w[redacted]d.  She presents to L&D for worsening contractions and LOF.   Reports active fetal movement  Contractions: every 5 to 7 minutes LOF/SROM: large amount of fluid at 0430 Vaginal bleeding: denies   Pregnancy Issues: 1. Elevated 1hr gtt - 3hr WNL  2. Cervical dysplasia - ASCUS, hrHPV +, Colpo - CIN1.  Needs biopsy postpartum.   Patient Active Problem List   Diagnosis Date Noted  . Normal labor 07/04/2020  . Cervical dysplasia 04/20/2020     Maternal Medical History:   Past Medical History:  Diagnosis Date  . Hypoglycemia   . Migraine     Past Surgical History:  Procedure Laterality Date  . DENTAL SURGERY      No Known Allergies  Prior to Admission medications   Medication Sig Start Date End Date Taking? Authorizing Provider  Prenatal Vit-Fe Fumarate-FA (PRENATAL MULTIVITAMIN) TABS tablet Take 1 tablet by mouth daily at 12 noon.   Yes [provider]  Pyridoxine HCl (VITAMIN B6) 50 MG TABS Take by mouth. Patient not taking: Reported on 07/04/2020 11/11/19   [provider]  doxylamine, Sleep, (UNISOM) 25 MG tablet Take 1/2 tab PO q6h prn nausea/vomiting pregnancy related 11/10/19 03/21/20  Eusebio Friendly B, PA-C  ESTARYLLA 0.25-35 MG-MCG tablet TK 1 T PO QD FOR BIRTH CONTROL. 08/25/18 03/21/20  [provider]     Pren[facility] care site:  [facility]  Social History: She  reports that she has never smoked. She has never used smokeless tobacco. She reports previous alcohol use. She reports that she does not use drugs.  Family History: family history includes Diabetes in her father and mother.   Review of Systems: A full review of systems was performed and negative except as noted in the HPI.     Physical Exam:  Vital Signs: BP 122/73 (BP Location: Right  Arm)   Pulse 86   Temp 98.5 F (36.9 C) (Oral)   Resp 16   Ht 5\' 2"  (1.575 m)   LMP 10/08/2019 (Exact Date)   BMI 26.53 kg/m  Physical Exam  General: no acute distress.  HEENT: normocephalic, atraumatic Heart: regular rate & rhythm.  No murmurs/rubs/gallops Lungs: clear to auscultation bilaterally, normal respiratory effort Abdomen: soft, gravid, non-tender;  Pelvic:   External: Normal external female genitalia  Cervix: Dilation: 5 / Effacement (%): 90 /      Extremities: non-tender, symmetric, No edema bilaterally.  DTRs: 2+/2+  Neurologic: Alert & oriented x 3.    No results found for this or any previous visit (from the past 24 hour(s)).  Pertinent Results:  Prenatal Labs: Blood type/Rh O pos   Antibody screen neg  Rubella Immune  Varicella Immune  RPR NR  HBsAg Neg  HIV NR  GC neg  Chlamydia neg  Genetic screening Declined   1 hour GTT 142  3 hour GTT [phone]  GBS Negative    FHT:  FHR: 135 bpm, variability: moderate,  accelerations:  Present,  decelerations:  Absent  -Difficulty obtaining continuous tracing due maternal movement in labor  Category/reactivity:  Category I UC:   regular, every 2-4 minutes   Cephalic by [place] and SVE   No results found.  Assessment:  Susan Hanna,Susan Hanna,Susan Hanna,Susan Hanna is a 30 y.o. G1P0 female at [redacted]w[redacted]d with  normal labor and SROM.   Plan:  1. Admit to Labor & Delivery; consents reviewed and obtained - Covid admission screen   2. Fetal Well being  - Fetal Tracing: cat 1 - Difficulty obtaining continuous tracing d/t maternal movement in labor.  Initial tracing cat 1 on admission. Able to intermittently auscultate FHT in 130's. Arlie up laboring at bedside.  - Group B Streptococcus ppx not indicated: GBS neg - Presentation: cephalic confirmed by SVE   3. Routine OB: - Prenatal labs reviewed, as above - Rh pos - CBC, T&S, RPR on admit - reg diet, IVF  4. Monitoring of labor  - Contractions monitored with external  toco - Pelvis adequate for trial of labor  - Plan for expectant management  - Augmentation with oxytocin as appropriate  - Plan for  continuous fetal monitoring - Maternal pain control as desired - Anticipate vaginal delivery  5. Post Partum Planning: - Infant feeding: TBD - Contraception: TBD - Tdap vaccine: given 04/27/2020 - Flu vaccine: declined   Gustavo Lah, CNM 07/04/20 7:22 AM  Margaretmary Eddy, CNM  Certified Nurse Midwife [facility]

## 2020-07-04 NOTE — OB Triage Note (Incomplete)
[facility] to [facility] with complaints of LOF that began around 0430 this morning. Pt states the fluid was large amount and green color. Pt states she has ctx about every 10 minutes. Pt also complains of spotting upon using the bathroom. Pt states positive FM. Monitors applied and assessing.

## 2020-07-04 NOTE — Discharge Summary (Signed)
Obstetrical Discharge Summary  Patient Name: Susan Hanna DOB: 05/17/1990 MRN: [id number]  Date of Admission: 07/04/2020 Date of Delivery: 07/04/20 Delivered by: Anselm Pancoast Date of Discharge: 07/05/2020  Primary OB: Phineas Real  [id number] last menstrual period was 10/08/2019 (exact date). EDC Estimated Date of Delivery: 07/14/20 Gestational Age at Delivery: [redacted]w[redacted]d   Antepartum complications:  1. Hemoglobinopathy 2. Abnormal 1 hr  Admitting Diagnosis: PROM/Active labor Secondary Diagnosis: Patient Active Problem List   Diagnosis Date Noted  . NSVD (normal spontaneous vaginal delivery) 07/04/2020  . PPH (postpartum hemorrhage) 07/04/2020  . Cervical dysplasia 04/20/2020    Augmentation: Pitocin Complications: Hemorrhage>1071mL  Intrapartum complications/course:  Delivery Type: spontaneous vaginal delivery Anesthesia: none Placenta: spontaneous Laceration: none Episiotomy: none Newborn Data: Live born female  Birth Weight: 6 lb 11.6 oz (3050 g) APGAR: 8, 9  Newborn Delivery   Birth date/time: 07/04/2020 09:39:00 Delivery type: Vaginal, Spontaneous      30 y.o. G1P1001 at [redacted]w[redacted]d presenting with PROM/active labor, SROM with thick MSAF.  She progressed to complete quickly however when we started pushing her contractions spaced out to every 30-45 mins.  Pitocin started we pushed with each contraction fetal head was finally delivered over an intact perineum, followed promptly by the shoulders. She was in control the whole time, and the baby placed on the maternal abdomen. Cord was clamped right at 1 min the FOB cut the baby's cord, and baby was taken to the warmer for suction.  The placenta delivered spontaneously and intact.  No laceration was noted. Pitocin was started immediately after the placenta, uterine atony was noted with brisk bleeding. of cytotec was placed rectally and 0.2 mg of Methergine IM was given. Uterine tone was firming up however bleeding was  still moderate.  TXA was started and bleeding and uterine tone became better.  Upon leaving the room bleeding was minimal and uterine tone was firm.  Mom and baby tolerated the procedure well.   Postpartum Procedures: None  Post partum course:  Patient had an uncomplicated postpartum course.  By time of discharge on PPD#1, her pain was controlled on oral pain medications; she had appropriate lochia and was ambulating, voiding without difficulty and tolerating regular diet.  She was deemed stable for discharge to home.    Discharge Physical Exam:  BP 103/62 (BP Location: Left Arm)   Pulse 96   Temp 98.1 F (36.7 C) (Oral)   Resp 20   Ht 5\' 2"  (1.575 m)   LMP 10/08/2019 (Exact Date)   SpO2 97%   Breastfeeding Unknown   BMI 26.53 kg/m   General: alert and no distress Pulm: normal respiratory effort Lochia: appropriate Abdomen: soft, NT Uterine Fundus: firm, below umbilicus Perineum: minimal edema, intact  Extremities: No evidence of DVT seen on physical exam. No lower extremity edema. Edinburgh:  Edinburgh Postnatal Depression Scale Screening Tool 07/04/2020 07/04/2020  I have been able to laugh and see the funny side of things. 0 (No Data)  I have looked forward with enjoyment to things. 0 -  I have blamed myself unnecessarily when things went wrong. 0 -  I have been anxious or worried for no good reason. 0 -  I have felt scared or panicky for no good reason. 0 -  Things have been getting on top of me. 0 -  I have been so unhappy that I have had difficulty sleeping. 0 -  I have felt sad or miserable. 0 -  I have been so unhappy that I  have been crying. 0 -  The thought of harming myself has occurred to me. 0 -  Edinburgh Postnatal Depression Scale Total 0 -     Labs: CBC Latest Ref Rng & Units 07/05/2020 07/04/2020  WBC 4.0 - 10.5 K/uL 11.7(H) 13.1(H)  Hemoglobin 12.0 - 15.0 g/dL 0.8(M) 57.8  Hematocrit 36.0 - 46.0 % 28.1(L) 39.8  Platelets 150 - 400 K/uL 231 271   O  POS Performed at [facility], [address] [place], [place] [address]  Hemoglobin  Date Value Ref Range Status  07/05/2020 9.7 (L) 12.0 - 15.0 g/dL Final    Comment:    REPEATED TO VERIFY   HCT  Date Value Ref Range Status  07/05/2020 28.1 (L) 36.0 - 46.0 % Final    Disposition: stable, discharge to home Baby Feeding: Both Baby Disposition: home with mom  Contraception: TBD  Prenatal Labs:  Blood type/Rh O pos  Antibody screen neg  Rubella Immune  Varicella Immune  RPR NR  HBsAg Neg  HIV NR  GC neg  Chlamydia neg  Genetic screening   1 hour GTT 142  3 hour GTT 85, 127, 134, 129  GBS Neg   Rh Immune globulin given: n/a Rubella vaccine given: n/a Varicella vaccine given: n/a Tdap vaccine given in AP or PP setting: given 04/27/20 Flu vaccine given in AP or PP setting: not given AP  Plan: Susan Hanna was discharged to home in good condition. Follow-up appointment with delivering provider in 6 weeks.  May follow up with Phineas Real if preferred.   Discharge Instructions: Per After Visit Summary. Activity: Advance as tolerated. Pelvic rest for 6 weeks.   Diet: Regular Discharge Medications: Allergies as of 07/05/2020   No Known Allergies     Medication List    STOP taking these medications   Vitamin B6 50 MG Tabs     TAKE these medications   acetaminophen 325 MG tablet Commonly known as: Tylenol Take 2 tablets (650 mg total) by mouth every 4 (four) hours as needed (for pain scale < 4).   docusate sodium 100 MG capsule Commonly known as: COLACE Take 1 capsule (100 mg total) by mouth 2 (two) times daily.   ferrous sulfate 325 (65 FE) MG tablet Take 1 tablet (325 mg total) by mouth 2 (two) times daily with a meal.   ibuprofen 600 MG tablet Commonly known as: ADVIL Take 1 tablet (600 mg total) by mouth every 6 (six) hours as needed for mild pain or moderate pain.   norethindrone 0.35 MG tablet Commonly known as: Ortho  Micronor Take 1 tablet (0.35 mg total) by mouth daily. Start 4-6 weeks postpartum   prenatal multivitamin Tabs tablet Take 1 tablet by mouth daily at 12 noon.      Outpatient follow up:   Follow-up Information    Center, [facility]. Schedule an appointment as soon as possible for a visit in 6 week(s).   Specialty: General Practice Why: postpartum visit  Contact information: [address] [place] [address] [phone]               Signed:  07/05/2020 12:29 PM  Margaretmary Eddy, CNM Certified Nurse Midwife [facility]

## 2020-07-04 NOTE — Discharge Instructions (Signed)

## 2020-07-05 LAB — CBC
HCT: 28.1 % — ABNORMAL LOW (ref 36.0–46.0)
Hemoglobin: 9.7 g/dL — ABNORMAL LOW (ref 12.0–15.0)
MCH: 31.7 pg (ref 26.0–34.0)
MCHC: 34.5 g/dL (ref 30.0–36.0)
MCV: 91.8 fL (ref 80.0–100.0)
Platelets: 231 10*3/uL (ref 150–400)
RBC: 3.06 MIL/uL — ABNORMAL LOW (ref 3.87–5.11)
RDW: 14.6 % (ref 11.5–15.5)
WBC: 11.7 10*3/uL — ABNORMAL HIGH (ref 4.0–10.5)
nRBC: 0 % (ref 0.0–0.2)

## 2020-07-05 MED ORDER — IBUPROFEN 600 MG PO TABS: 600.0000 mg | ORAL_TABLET | Freq: Four times a day (QID) | ORAL | 0 refills | Status: DC | PRN

## 2020-07-05 MED ORDER — DOCUSATE SODIUM 100 MG PO CAPS: 100.0000 mg | ORAL_CAPSULE | Freq: Two times a day (BID) | ORAL | 0 refills | Status: DC

## 2020-07-05 MED ORDER — FERROUS SULFATE 325 (65 FE) MG PO TABS: 325.0000 mg | ORAL_TABLET | Freq: Two times a day (BID) | ORAL | 3 refills | Status: AC

## 2020-07-05 MED ORDER — ACETAMINOPHEN 325 MG PO TABS: 650.0000 mg | ORAL_TABLET | ORAL | Status: AC | PRN

## 2020-07-05 MED ORDER — NORETHINDRONE 0.35 MG PO TABS: 1.0000 | ORAL_TABLET | Freq: Every day | ORAL | 11 refills | Status: DC

## 2020-07-05 NOTE — Progress Notes (Signed)
Postpartum Day  1  Subjective: no complaints  Doing well, no concerns. Ambulating without difficulty, pain managed with PO meds, tolerating regular diet, and voiding without difficulty.   No fever/chills, chest pain, shortness of breath, nausea/vomiting, or leg pain. No nipple or breast pain. No headache, visual changes, or RUQ/epigastric pain.  Objective: BP 103/62 (BP Location: Left Arm)   Pulse 96   Temp 98.1 F (36.7 C) (Oral)   Resp 20   Ht 5\' 2"  (1.575 m)   LMP 10/08/2019 (Exact Date)   SpO2 97%   Breastfeeding Unknown   BMI 26.53 kg/m    Physical Exam:  General: alert, cooperative and no distress Breasts: soft/nontender CV: RRR Pulm: nl effort, CTABL Abdomen: soft, non-tender, active bowel sounds Uterine Fundus: firm Perineum: minimal edema, intact Lochia: appropriate DVT Evaluation: No evidence of DVT seen on physical exam.  Recent Labs    07/04/20 0728 07/05/20 0452  HGB 13.6 9.7*  HCT 39.8 28.1*  WBC 13.1* 11.7*  PLT 271 231    Assessment/Plan: 29 y.o. G1P1001 postpartum day # 1  -Continue routine postpartum care -Lactation consult PRN for breastfeeding  -Discussed contraceptive options including implant, IUDs hormonal and non-hormonal, injection, pills/ring/patch, condoms, and NFP.  Desires POP's -Acute blood loss anemia - hemodynamically stable and asymptomatic; start PO ferrous sulfate BID with stool softeners  -Immunization status:   all immunizations up to date   Disposition: Desires discharge home today   LOS: 1 day   07/07/20, Gustavo Lah 07/05/2020, 8:47 AM   ----- 07/07/2020  Certified Nurse Midwife [facility]

## 2020-07-05 NOTE — Lactation Note (Signed)
This note was copied from a baby's chart. Lactation Consultation Note  Patient Name: Susan Hanna [id number] Date: 07/05/2020 Reason for consult: Follow-up assessment;Mother's request;Primapara;Early term 37-38.6wks Age:29 hours  Lactation at bedside to provide a hand pump before discharge home. Set-up pump, educated on set-up, cleaning, milk storage, warming and thawing. Mom verbalizes understanding.  Maternal Data Has patient been taught Hand Expression?: Yes Does the patient have breastfeeding experience prior to this delivery?: No  Feeding Mother's Current Feeding Choice: Breast Milk  LATCH Score                    Lactation Tools Discussed/Used Tools: Flanges;Pump Flange Size: 24 Breast pump type: Manual Pump Education: Setup, frequency, and cleaning;Milk Storage Pumping frequency: as needed  Interventions Interventions: Breast feeding basics reviewed;Education;Hand express;Coconut oil  Discharge Discharge Education: Engorgement and breast care;Warning signs for feeding baby;Outpatient recommendation Pump: Manual (given in hospital)  Consult Status Consult Status: Complete    Danford Bad 07/05/2020, 2:04 PM

## 2020-07-05 NOTE — Lactation Note (Signed)
This note was copied from a baby's chart. Lactation Consultation Note  Patient Name: Susan Hanna [id number] Date: 07/05/2020 Reason for consult: Initial assessment;Primapara;Early term 37-38.6wks Age:29 hours  Initial lactation visit. Mom is G1P1 SVD 25 hours ago with anticipated discharge later this afternoon. Nursing staff assisted with first feedings and education (no lactation staff on 5/23). Today mom reports baby continuing to latch well, no overall pain/discomfort- some tenderness. LC reviewed output for HOL, bili levels, weight loss, and number of feeds, all showing that baby is doing well. LC discussed position, alignment, latch, transient nipple tenderness/pain- use of coconut oil or EBM for healing.  Encouraged wide open mouth, flanged top/bottom lips, and keeping baby awake at the breast. LC reviewed newborn stomach sizes, feeding patterns, early cues, feeding on demand, normal course of lactation and milk supply and demand. Reviewed anticipated breast changes, breast fullness and engorgement, and nipple care.  Information for outpatient lactation services and community breastfeeding resources and support.   Encouraged to call out today for support, observation of feeding, or with questions/concerns.    Maternal Data Has patient been taught Hand Expression?: Yes Does the patient have breastfeeding experience prior to this delivery?: No  Feeding Mother's Current Feeding Choice: Breast Milk  LATCH Score                    Lactation Tools Discussed/Used    Interventions Interventions: Breast feeding basics reviewed;Education;Hand express;Coconut oil  Discharge Discharge Education: Engorgement and breast care;Warning signs for feeding baby;Outpatient recommendation  Consult Status Consult Status: Complete    Danford Bad 07/05/2020, 11:36 AM

## 2020-07-05 NOTE — Progress Notes (Signed)
Patient discharged home with infant. Discharge instructions and prescriptions given and reviewed with patient. Patient verbalized understanding. Will be escorted out by auxillary once ride arrives.

## 2020-07-06 LAB — SURGICAL PATHOLOGY

## 2020-12-21 ENCOUNTER — Encounter: Payer: Self-pay | Admitting: Emergency Medicine

## 2020-12-21 ENCOUNTER — Ambulatory Visit
Admission: EM | Admit: 2020-12-21 | Discharge: 2020-12-21 | Disposition: A | Discharge status: Home / Self Care | Payer: Medicaid Other | Attending: Emergency Medicine | Admitting: Emergency Medicine

## 2020-12-21 ENCOUNTER — Other Ambulatory Visit: Payer: Self-pay

## 2020-12-21 DIAGNOSIS — J069 Acute upper respiratory infection, unspecified: Secondary | ICD-10-CM | POA: Diagnosis present

## 2020-12-21 LAB — RAPID INFLUENZA A&B ANTIGENS
Influenza A (ARMC): NEGATIVE
Influenza B (ARMC): NEGATIVE

## 2020-12-21 MED ORDER — IPRATROPIUM BROMIDE 0.06 % NA SOLN: 2.0000 | Freq: Four times a day (QID) | NASAL | 12 refills | Status: AC

## 2020-12-21 MED ORDER — PROMETHAZINE-PHENYLEPHRINE 6.25-5 MG/5ML PO SYRP: 5.0000 mL | ORAL_SOLUTION | Freq: Four times a day (QID) | ORAL | 0 refills | Status: DC | PRN

## 2020-12-21 MED ORDER — BENZONATATE 100 MG PO CAPS: 200.0000 mg | ORAL_CAPSULE | Freq: Three times a day (TID) | ORAL | 0 refills | Status: DC

## 2020-12-21 NOTE — ED Provider Notes (Signed)
[facility]    CSN: [id number] Arrival date & time: 12/21/20  1510      History   Chief Complaint Chief Complaint  Patient presents with   Cough   Facial Pain   Nasal Congestion    HPI Susan Hanna is a 30 y.o. female.   HPI  30 year old female here for evaluation of respiratory complaints.  Patient reports that for the last 3 days she has been experiencing subjective fever, headache, nasal congestion with yellow nasal discharge initially but now she is not able to get anything out of her nasal passages, ear pain, facial pain, and a nonproductive cough.  She denies any shortness of breath or wheezing, nausea, vomiting, diarrhea, body aches, or known sick contacts.  Past Medical History:  Diagnosis Date   Hypoglycemia    Migraine     Patient Active Problem List   Diagnosis Date Noted   NSVD (normal spontaneous vaginal delivery) 07/04/2020   PPH (postpartum hemorrhage) 07/04/2020   Cervical dysplasia 04/20/2020    Past Surgical History:  Procedure Laterality Date   DENTAL SURGERY      OB History     Gravida  1   Para  1   Term  1   Preterm      AB      Living  1      SAB      IAB      Ectopic      Multiple  0   Live Births  1            Home Medications    Prior to Admission medications   Medication Sig Start Date End Date Taking? Authorizing Provider  benzonatate (TESSALON) 100 MG capsule Take 2 capsules (200 mg total) by mouth every 8 (eight) hours. 12/21/20  Yes Becky Augusta, NP  ipratropium (ATROVENT) 0.06 % nasal spray Place 2 sprays into both nostrils 4 (four) times daily. 12/21/20  Yes Becky Augusta, NP  promethazine-phenylephrine (PROMETHAZINE VC) 6.25-5 MG/5ML SYRP Take 5 mLs by mouth every 6 (six) hours as needed for congestion. 12/21/20  Yes Becky Augusta, NP  acetaminophen (TYLENOL) 325 MG tablet Take 2 tablets (650 mg total) by mouth every 4 (four) hours as needed (for pain scale < 4). 07/05/20   Gustavo Lah, CNM  docusate sodium (COLACE) 100 MG capsule Take 1 capsule (100 mg total) by mouth 2 (two) times daily. 07/05/20   Gustavo Lah, CNM  ferrous sulfate 325 (65 FE) MG tablet Take 1 tablet (325 mg total) by mouth 2 (two) times daily with a meal. 07/05/20   Gustavo Lah, CNM  ibuprofen (ADVIL) 600 MG tablet Take 1 tablet (600 mg total) by mouth every 6 (six) hours as needed for mild pain or moderate pain. 07/05/20   Gustavo Lah, CNM  norethindrone (ORTHO MICRONOR) 0.35 MG tablet Take 1 tablet (0.35 mg total) by mouth daily. Start 4-6 weeks postpartum 07/05/20 07/05/21  Gustavo Lah, CNM  Prenatal Vit-Fe Fumarate-FA (PRENATAL MULTIVITAMIN) TABS tablet Take 1 tablet by mouth daily at 12 noon.    [provider]  doxylamine, Sleep, (UNISOM) 25 MG tablet Take 1/2 tab PO q6h prn nausea/vomiting pregnancy related 11/10/19 03/21/20  Eusebio Friendly B, PA-C  ESTARYLLA 0.25-35 MG-MCG tablet TK 1 T PO QD FOR BIRTH CONTROL. 08/25/18 03/21/20  [provider]    Family History Family History  Problem Relation Age of Onset   Diabetes Mother    Diabetes  Father     Social History Social History   Tobacco Use   Smoking status: Never   Smokeless tobacco: Never  Vaping Use   Vaping Use: Never used  Substance Use Topics   Alcohol use: Not Currently    Comment: social   Drug use: Never     Allergies   Patient has no known allergies.   Review of Systems Review of Systems  Constitutional:  Positive for fever. Negative for activity change and appetite change.  HENT:  Positive for congestion, ear pain and rhinorrhea.   Respiratory:  Positive for cough. Negative for shortness of breath and wheezing.   Gastrointestinal:  Negative for diarrhea, nausea and vomiting.  Musculoskeletal:  Negative for arthralgias and myalgias.  Skin:  Negative for rash.  Neurological:  Positive for headaches.  Hematological: Negative.   Psychiatric/Behavioral: Negative.      Physical Exam Triage  Vital Signs ED Triage Vitals  Enc Vitals Group     BP 12/21/20 1659 122/86     Pulse Rate 12/21/20 1659 76     Resp 12/21/20 1659 18     Temp 12/21/20 1659 98.3 F (36.8 C)     Temp Source 12/21/20 1659 Oral     SpO2 12/21/20 1659 100 %     Weight --      Height --      Head Circumference --      Peak Flow --      Pain Score 12/21/20 1657 7     Pain Loc --      Pain Edu? --      Excl. in [place]? --    No data found.  Updated Vital Signs BP 122/86 (BP Location: Right Arm)   Pulse 76   Temp 98.3 F (36.8 C) (Oral)   Resp 18   LMP 11/20/2020 (Approximate)   SpO2 100%   Visual Acuity Right Eye Distance:   Left Eye Distance:   Bilateral Distance:    Right Eye Near:   Left Eye Near:    Bilateral Near:     Physical Exam Vitals and nursing note reviewed.  Constitutional:      General: She is not in acute distress.    Appearance: Normal appearance. She is ill-appearing.  HENT:     Head: Normocephalic and atraumatic.     Right Ear: Tympanic membrane, ear canal and external ear normal. There is no impacted cerumen.     Left Ear: Tympanic membrane, ear canal and external ear normal. There is no impacted cerumen.     Nose: Congestion and rhinorrhea present.     Mouth/Throat:     Mouth: Mucous membranes are moist.     Pharynx: Oropharynx is clear. Posterior oropharyngeal erythema present.  Cardiovascular:     Rate and Rhythm: Normal rate and regular rhythm.     Pulses: Normal pulses.     Heart sounds: Normal heart sounds. No murmur heard.   No gallop.  Pulmonary:     Effort: Pulmonary effort is normal.     Breath sounds: Normal breath sounds. No wheezing, rhonchi or rales.  Musculoskeletal:     Cervical back: Normal range of motion and neck supple.  Lymphadenopathy:     Cervical: No cervical adenopathy.  Skin:    General: Skin is warm and dry.     Capillary Refill: Capillary refill takes less than 2 seconds.     Findings: No erythema or rash.  Neurological:      General: No  focal deficit present.     Mental Status: She is alert and oriented to person, place, and time.  Psychiatric:        Mood and Affect: Mood normal.        Behavior: Behavior normal.        Thought Content: Thought content normal.        Judgment: Judgment normal.     UC Treatments / Results  Labs (all labs ordered are listed, but only abnormal results are displayed) Labs Reviewed  RAPID INFLUENZA A&B ANTIGENS    EKG   Radiology No results found.  Procedures Procedures (including critical care time)  Medications Ordered in UC Medications - No data to display  Initial Impression / Assessment and Plan / UC Course  I have reviewed the triage vital signs and the nursing notes.  Pertinent labs & imaging results that were available during my care of the patient were reviewed by me and considered in my medical decision making (see chart for details).  Patient is a pleasant, though ill-appearing, 30 year old female here for evaluation of respiratory complaints as outlined HPI above.  Patient's physical exam reveals pearly gray tympanic membranes bilaterally with normal light reflex and clear external auditory canals.  Nasal mucosa is erythematous and edematous with clear nasal discharge in both nares.  Oropharyngeal exam reveals posterior oropharyngeal erythema with clear postnasal drip.  No cervical lymphadenopathy appreciated on exam.  Cardiopulmonary exam reveals clear lung sounds in all fields.  Patient exam is consistent with a viral respiratory faction.  Will swab patient for influenza.  Influenza panel is negative.  Will discharge patient home with a diagnosis of viral URI with a cough and provide Atrovent nasal spray to help with nasal congestion and postnasal drip, Tessalon Perles, and Promethazine VC cough syrup.   Final Clinical Impressions(s) / UC Diagnoses   Final diagnoses:  Viral URI with cough     Discharge Instructions      Use the Atrovent nasal  spray, 2 squirts in each nostril every 6 hours, as needed for runny nose and postnasal drip.  Use the Tessalon Perles every 8 hours during the day.  Take them with a small sip of water.  They may give you some numbness to the base of your tongue or a metallic taste in your mouth, this is normal.  Use the Promethazine VC cough syrup at bedtime for cough and congestion.  It will make you drowsy so do not take it during the day.  Use over-the-counter Tylenol and ibuprofen according to the package instructions as needed for fever and headache.  Return for reevaluation or see your primary care provider for any new or worsening symptoms.      ED Prescriptions     Medication Sig Dispense Auth. Provider   benzonatate (TESSALON) 100 MG capsule Take 2 capsules (200 mg total) by mouth every 8 (eight) hours. 21 capsule Margarette Canada, NP   ipratropium (ATROVENT) 0.06 % nasal spray Place 2 sprays into both nostrils 4 (four) times daily. 15 mL Margarette Canada, NP   promethazine-phenylephrine (PROMETHAZINE VC) 6.25-5 MG/5ML SYRP Take 5 mLs by mouth every 6 (six) hours as needed for congestion. 180 mL Margarette Canada, NP      PDMP not reviewed this encounter.   Margarette Canada, NP 12/21/20 1734

## 2020-12-21 NOTE — Discharge Instructions (Addendum)
Use the Atrovent nasal spray, 2 squirts in each nostril every 6 hours, as needed for runny nose and postnasal drip.  Use the Tessalon Perles every 8 hours during the day.  Take them with a small sip of water.  They may give you some numbness to the base of your tongue or a metallic taste in your mouth, this is normal.  Use the Promethazine VC cough syrup at bedtime for cough and congestion.  It will make you drowsy so do not take it during the day.  Use over-the-counter Tylenol and ibuprofen according to the package instructions as needed for fever and headache.  Return for reevaluation or see your primary care provider for any new or worsening symptoms.

## 2020-12-21 NOTE — ED Triage Notes (Signed)
Pt presents today with c/o nasal congestion, facial pain, cough, fever headache since Sunday. She was tested on Sunday at [facility] for Covid, negative. No meds pta.

## 2021-12-28 ENCOUNTER — Encounter: Payer: Self-pay | Admitting: Emergency Medicine

## 2021-12-28 ENCOUNTER — Ambulatory Visit
Admission: EM | Admit: 2021-12-28 | Discharge: 2021-12-28 | Disposition: A | Discharge status: Home / Self Care | Payer: Medicaid Other

## 2021-12-28 ENCOUNTER — Ambulatory Visit (INDEPENDENT_AMBULATORY_CARE_PROVIDER_SITE_OTHER): Payer: Medicaid Other

## 2021-12-28 DIAGNOSIS — S7002XA Contusion of left hip, initial encounter: Secondary | ICD-10-CM | POA: Diagnosis not present

## 2021-12-28 DIAGNOSIS — M25512 Pain in left shoulder: Secondary | ICD-10-CM

## 2021-12-28 DIAGNOSIS — S46912A Strain of unspecified muscle, fascia and tendon at shoulder and upper arm level, left arm, initial encounter: Secondary | ICD-10-CM | POA: Diagnosis not present

## 2021-12-28 DIAGNOSIS — M545 Low back pain, unspecified: Secondary | ICD-10-CM | POA: Diagnosis not present

## 2021-12-28 DIAGNOSIS — R102 Pelvic and perineal pain: Secondary | ICD-10-CM | POA: Diagnosis not present

## 2021-12-28 DIAGNOSIS — S161XXA Strain of muscle, fascia and tendon at neck level, initial encounter: Secondary | ICD-10-CM | POA: Diagnosis not present

## 2021-12-28 DIAGNOSIS — S39012A Strain of muscle, fascia and tendon of lower back, initial encounter: Secondary | ICD-10-CM

## 2021-12-28 MED ORDER — IBUPROFEN 800 MG PO TABS: 800.0000 mg | ORAL_TABLET | Freq: Three times a day (TID) | ORAL | 0 refills | Status: DC

## 2021-12-28 MED ORDER — ACETAMINOPHEN 500 MG PO TABS
1000.0000 mg | ORAL_TABLET | Freq: Once | ORAL | Status: AC
  Administered 2021-12-28: 1000 mg via ORAL

## 2021-12-28 MED ORDER — METHOCARBAMOL 500 MG PO TABS: 500.0000 mg | ORAL_TABLET | Freq: Three times a day (TID) | ORAL | 0 refills | Status: DC

## 2021-12-28 NOTE — Discharge Instructions (Signed)
Apply ice on areas of pain for 20 minutes 2-4 times a day today and tomorrow. There after you may alternate with heat.   To get copy of your back xray on a disc if [facility] can't get them, you may call [facility] at [phone]. You will need to show them your ID
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
## 2022-12-11 ENCOUNTER — Ambulatory Visit
Admission: EM | Admit: 2022-12-11 | Discharge: 2022-12-11 | Disposition: A | Discharge status: Home / Self Care | Payer: BC Managed Care – PPO | Attending: Emergency Medicine | Admitting: Emergency Medicine

## 2022-12-11 DIAGNOSIS — B349 Viral infection, unspecified: Secondary | ICD-10-CM | POA: Diagnosis not present

## 2022-12-11 LAB — URINALYSIS, W/ REFLEX TO CULTURE (INFECTION SUSPECTED)
Bilirubin Urine: NEGATIVE
Glucose, UA: NEGATIVE mg/dL
Ketones, ur: NEGATIVE mg/dL
Nitrite: NEGATIVE
Protein, ur: NEGATIVE mg/dL
Specific Gravity, Urine: 1.02 (ref 1.005–1.030)
pH: 7.5 (ref 5.0–8.0)

## 2022-12-11 LAB — PREGNANCY, URINE: Preg Test, Ur: NEGATIVE

## 2022-12-11 MED ORDER — ONDANSETRON HCL 4 MG PO TABS: 4.0000 mg | ORAL_TABLET | Freq: Three times a day (TID) | ORAL | 0 refills | Status: DC | PRN

## 2022-12-11 MED ORDER — ONDANSETRON 4 MG PO TBDP
4.0000 mg | ORAL_TABLET | Freq: Once | ORAL | Status: AC
  Administered 2022-12-11: 4 mg via ORAL

## 2022-12-11 NOTE — ED Provider Notes (Signed)
[facility]    CSN: [id number] Arrival date & time: 12/11/22  1454      History   Chief Complaint Chief Complaint  Patient presents with   Nausea   Dizziness   Headache   Emesis    HPI Susan Hanna is a 32 y.o. female.   32 year old female pt, Susan Hanna, presents to urgent care for evaluation of headache,dizzy, vomiting that started 4 days prior. Pt denies known illness exposure,  no insect bite or new foods, no diarrhea. Pt reports last vomited Sunday. Menses started yesterday. 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 Pt states she feels a little nauseated at present HA has improved 8/10), not worse HA of life, denies any injury or trauma.   VS are normal in office today  Tried over the counter meds, taking po's  The history is provided by the patient. No language interpreter was used.    Past Medical History:  Diagnosis Date   Hypoglycemia    Migraine     Patient Active Problem List   Diagnosis Date Noted   Nonspecific syndrome suggestive of viral illness 12/11/2022   NSVD (normal spontaneous vaginal delivery) 07/04/2020   PPH (postpartum hemorrhage) 07/04/2020   Cervical dysplasia 04/20/2020    Past Surgical History:  Procedure Laterality Date   DENTAL SURGERY      OB History     Gravida  1   Para  1   Term  1   Preterm      AB      Living  1      SAB      IAB      Ectopic      Multiple  0   Live Births  1            Home Medications    Prior to Admission medications   Medication Sig Start Date End Date Taking? Authorizing Provider  Etonogestrel (NEXPLANON [place]) Inject into the skin.   Yes [provider]  ondansetron (ZOFRAN) 4 MG tablet Take 1 tablet (4 mg total) by mouth every 8 (eight) hours as needed for nausea or vomiting. 12/11/22  Yes Koki Buxton, Para March, NP  acetaminophen (TYLENOL) 325 MG tablet Take 2 tablets (650 mg total) by mouth every 4 (four) hours as  needed (for pain scale < 4). 07/05/20   Gustavo Lah, CNM  benzonatate (TESSALON) 100 MG capsule Take 2 capsules (200 mg total) by mouth every 8 (eight) hours. 12/21/20   Becky Augusta, NP  docusate sodium (COLACE) 100 MG capsule Take 1 capsule (100 mg total) by mouth 2 (two) times daily. 07/05/20   Gustavo Lah, CNM  ferrous sulfate 325 (65 FE) MG tablet Take 1 tablet (325 mg total) by mouth 2 (two) times daily with a meal. 07/05/20   Gustavo Lah, CNM  ibuprofen (ADVIL) 800 MG tablet Take 1 tablet (800 mg total) by mouth 3 (three) times daily. 12/28/21   Rodriguez-Southworth, Nettie Elm, PA-C  ipratropium (ATROVENT) 0.06 % nasal spray Place 2 sprays into both nostrils 4 (four) times daily. 12/21/20   Becky Augusta, NP  methocarbamol (ROBAXIN) 500 MG tablet Take 1 tablet (500 mg total) by mouth 3 (three) times daily. 12/28/21   Rodriguez-Southworth, Nettie Elm, PA-C  norethindrone (ORTHO MICRONOR) 0.35 MG tablet Take 1 tablet (0.35 mg total) by mouth daily. Start 4-6 weeks postpartum 07/05/20 07/05/21  Gustavo Lah, CNM  Prenatal Vit-Fe Fumarate-FA (PRENATAL MULTIVITAMIN) TABS tablet Take  1 tablet by mouth daily at 12 noon.    [provider]  promethazine-phenylephrine (PROMETHAZINE VC) 6.25-5 MG/5ML SYRP Take 5 mLs by mouth every 6 (six) hours as needed for congestion. 12/21/20   Becky Augusta, NP  doxylamine, Sleep, (UNISOM) 25 MG tablet Take 1/2 tab PO q6h prn nausea/vomiting pregnancy related 11/10/19 03/21/20  Eusebio Friendly B, PA-C  ESTARYLLA 0.25-35 MG-MCG tablet TK 1 T PO QD FOR BIRTH CONTROL. 08/25/18 03/21/20  [provider]    Family History Family History  Problem Relation Age of Onset   Diabetes Mother    Diabetes Father     Social History Social History   Tobacco Use   Smoking status: Never   Smokeless tobacco: Never  Vaping Use   Vaping status: Never Used  Substance Use Topics   Alcohol use: Not Currently    Comment: social   Drug use: Never     Allergies    Patient has no known allergies.   Review of Systems Review of Systems  Constitutional:  Negative for fever.  HENT:  Negative for congestion.   Eyes:  Negative for photophobia, pain and visual disturbance.  Cardiovascular:  Negative for chest pain and palpitations.  Gastrointestinal:  Positive for nausea and vomiting. Negative for abdominal pain and diarrhea.  Musculoskeletal:  Negative for myalgias.  Neurological:  Positive for dizziness and headaches.  All other systems reviewed and are negative.    Physical Exam Triage Vital Signs ED Triage Vitals  Encounter Vitals Group     BP 12/11/22 1528 136/85     Systolic BP Percentile --      Diastolic BP Percentile --      Pulse Rate 12/11/22 1528 68     Resp 12/11/22 1528 17     Temp 12/11/22 1528 98.5 F (36.9 C)     Temp Source 12/11/22 1528 Oral     SpO2 12/11/22 1528 96 %     Weight --      Height --      Head Circumference --      Peak Flow --      Pain Score 12/11/22 1527 8     Pain Loc --      Pain Education --      Exclude from Growth Chart --    No data found.  Updated Vital Signs BP 136/85 (BP Location: Right Arm)   Pulse 68   Temp 98.5 F (36.9 C) (Oral)   Resp 17   LMP 12/10/2022   SpO2 96%   Visual Acuity Right Eye Distance:   Left Eye Distance:   Bilateral Distance:    Right Eye Near:   Left Eye Near:    Bilateral Near:     Physical Exam Vitals and nursing note reviewed.  Constitutional:      Appearance: She is well-developed and well-groomed.  Eyes:     General: Lids are normal. Vision grossly intact.     Extraocular Movements: Extraocular movements intact.     Right eye: No nystagmus.     Left eye: No nystagmus.     Conjunctiva/sclera: Conjunctivae normal.     Pupils: Pupils are equal, round, and reactive to light.  Cardiovascular:     Rate and Rhythm: Normal rate and regular rhythm.     Pulses: Normal pulses.     Heart sounds: Normal heart sounds.  Pulmonary:     Effort: Pulmonary  effort is normal.     Breath sounds: Normal breath sounds  and air entry.  Neurological:     General: No focal deficit present.     Mental Status: She is alert and oriented to person, place, and time.     Cranial Nerves: No cranial nerve deficit.     Sensory: No sensory deficit.     Motor: Motor function is intact.  Psychiatric:        Attention and Perception: Attention normal.        Mood and Affect: Mood normal.        Speech: Speech normal.        Behavior: Behavior normal. Behavior is cooperative.      UC Treatments / Results  Labs (all labs ordered are listed, but only abnormal results are displayed) Labs Reviewed  URINALYSIS, W/ REFLEX TO CULTURE (INFECTION SUSPECTED) - Abnormal; Notable for the following components:      Result Value   Hgb urine dipstick MODERATE (*)    Leukocytes,Ua SMALL (*)    Bacteria, UA FEW (*)    All other components within normal limits  PREGNANCY, URINE    EKG   Radiology No results found.  Procedures Procedures (including critical care time)  Medications Ordered in UC Medications  ondansetron (ZOFRAN-ODT) disintegrating tablet 4 mg (4 mg Oral Given 12/11/22 1555)    Initial Impression / Assessment and Plan / UC Course  I have reviewed the triage vital signs and the nursing notes.  Pertinent labs & imaging results that were available during my care of the patient were reviewed by me and considered in my medical decision making (see chart for details).  Clinical Course as of 12/11/22 2156  Tue Dec 11, 2022  1548 Ua is contaminated, no ketones, pt is on menses [JD]    Clinical Course User Index [JD] Maddyx Vallie, Para March, NP   Exam findings discussed with patient, patient verbalized understanding to this provider, work note given.  Ddx: Viral illness, Headache, dehydration, Menstrual migraine Final Clinical Impressions(s) / UC Diagnoses   Final diagnoses:  Nonspecific syndrome suggestive of viral illness     Discharge  Instructions      Most likely you have a viral illness: no antibiotic as indicated at this time, your urine is negative for dehydration or infection .  Vital signs are normal in the urgent care which is reassuring ,your pregnancy test is negative . Make sure to drink plenty of fluids to stay hydrated(gatorade, water, popsicles,jello,etc), avoid caffeine products. Follow up with PCP.  May alternate Tylenol, ibuprofen as label directed.  If you have worsening symptoms(loss of function, worst headache of life, unable to keep fluids down, etc., go to the emergency room for further evaluation).  Return as needed.    ED Prescriptions     Medication Sig Dispense Auth. Provider   ondansetron (ZOFRAN) 4 MG tablet Take 1 tablet (4 mg total) by mouth every 8 (eight) hours as needed for nausea or vomiting. 10 tablet Benn Tarver, Para March, NP      PDMP not reviewed this encounter.   Clancy Gourd, NP 12/11/22 2156

## 2022-12-11 NOTE — ED Triage Notes (Signed)
headaches, dizzy, vomiting since last friday   No fever or diarrhea . LMP- patient started Monday 12/10/22

## 2022-12-11 NOTE — Discharge Instructions (Addendum)
Most likely you have a viral illness: no antibiotic as indicated at this time, your urine is negative for dehydration or infection .  Vital signs are normal in the urgent care which is reassuring ,your pregnancy test is negative . Make sure to drink plenty of fluids to stay hydrated(gatorade, water, popsicles,jello,etc), avoid caffeine products. Follow up with PCP.  May alternate Tylenol, ibuprofen as label directed.  If you have worsening symptoms(loss of function, worst headache of life, unable to keep fluids down, etc., go to the emergency room for further evaluation).  Return as needed.

## 2023-02-04 ENCOUNTER — Ambulatory Visit
Admission: EM | Admit: 2023-02-04 | Discharge: 2023-02-04 | Disposition: A | Discharge status: Home / Self Care | Payer: BC Managed Care – PPO | Attending: Emergency Medicine | Admitting: Emergency Medicine

## 2023-02-04 DIAGNOSIS — N390 Urinary tract infection, site not specified: Secondary | ICD-10-CM

## 2023-02-04 LAB — URINALYSIS, W/ REFLEX TO CULTURE (INFECTION SUSPECTED)
Bilirubin Urine: NEGATIVE
Glucose, UA: NEGATIVE mg/dL
Ketones, ur: NEGATIVE mg/dL
Nitrite: NEGATIVE
Protein, ur: NEGATIVE mg/dL
Specific Gravity, Urine: 1.025 (ref 1.005–1.030)
pH: 6 (ref 5.0–8.0)

## 2023-02-04 MED ORDER — PHENAZOPYRIDINE HCL 200 MG PO TABS: 200.0000 mg | ORAL_TABLET | Freq: Three times a day (TID) | ORAL | 0 refills | Status: DC

## 2023-02-04 MED ORDER — NITROFURANTOIN MONOHYD MACRO 100 MG PO CAPS: 100.0000 mg | ORAL_CAPSULE | Freq: Two times a day (BID) | ORAL | 0 refills | Status: DC

## 2023-02-04 NOTE — Discharge Instructions (Signed)
Take the Macrobid twice daily for 5 days with food for treatment of urinary tract infection. ° °Use the Pyridium every 8 hours as needed for urinary discomfort.  This will turn your urine a bright red-orange. ° °Increase your oral fluid intake so that you increase your urine production and or flushing your urinary system. ° °Take an over-the-counter probiotic, such as Culturelle-Align-Activia, 1 hour after each dose of antibiotic to prevent diarrhea or yeast infections from forming. ° °We will culture urine and change the antibiotics if necessary. ° °Return for reevaluation, or see your primary care provider, for any new or worsening symptoms.  °

## 2023-02-04 NOTE — ED Triage Notes (Addendum)
Pt presents to UC c/o dysuria, pelvic pressure onset Friday, pt states she has been pushing lots of fluids, denies any hematuria, denies any discharge. Pt has been taking tylenol and azo for sxs.

## 2023-02-04 NOTE — ED Provider Notes (Signed)
[facility]    CSN: [id number] Arrival date & time: 02/04/23  1053      History   Chief Complaint Chief Complaint  Patient presents with   Dysuria    HPI Susan Hanna is a 32 y.o. female.   HPI  32 year old female with a past medical history significant for migraine headaches, hypoglycemia, and cervical dysplasia presents for evaluation of burning with urination and bladder pressure that started 3 days ago.  She is also endorsing some low back pain.  She denies any fever, nausea or vomiting, or blood in her urine.  She also denies vaginal discharge or itching.  She reports that she has been pushing fluids and taking Tylenol and Azo for her symptoms.  Past Medical History:  Diagnosis Date   Hypoglycemia    Migraine     Patient Active Problem List   Diagnosis Date Noted   Nonspecific syndrome suggestive of viral illness 12/11/2022   NSVD (normal spontaneous vaginal delivery) 07/04/2020   PPH (postpartum hemorrhage) 07/04/2020   Cervical dysplasia 04/20/2020    Past Surgical History:  Procedure Laterality Date   DENTAL SURGERY      OB History     Gravida  1   Para  1   Term  1   Preterm      AB      Living  1      SAB      IAB      Ectopic      Multiple  0   Live Births  1            Home Medications    Prior to Admission medications   Medication Sig Start Date End Date Taking? Authorizing Provider  nitrofurantoin, macrocrystal-monohydrate, (MACROBID) 100 MG capsule Take 1 capsule (100 mg total) by mouth 2 (two) times daily. 02/04/23  Yes Becky Augusta, NP  phenazopyridine (PYRIDIUM) 200 MG tablet Take 1 tablet (200 mg total) by mouth 3 (three) times daily. 02/04/23  Yes Becky Augusta, NP  acetaminophen (TYLENOL) 325 MG tablet Take 2 tablets (650 mg total) by mouth every 4 (four) hours as needed (for pain scale < 4). 07/05/20   Gustavo Lah, CNM  docusate sodium (COLACE) 100 MG capsule Take 1 capsule (100 mg total)  by mouth 2 (two) times daily. 07/05/20   Gustavo Lah, CNM  Etonogestrel (NEXPLANON [place]mao) Inject into the skin.    [provider]  ferrous sulfate 325 (65 FE) MG tablet Take 1 tablet (325 mg total) by mouth 2 (two) times daily with a meal. 07/05/20   Gustavo Lah, CNM  ibuprofen (ADVIL) 800 MG tablet Take 1 tablet (800 mg total) by mouth 3 (three) times daily. 12/28/21   Rodriguez-Southworth, Nettie Elm, PA-C  ipratropium (ATROVENT) 0.06 % nasal spray Place 2 sprays into both nostrils 4 (four) times daily. 12/21/20   Becky Augusta, NP  methocarbamol (ROBAXIN) 500 MG tablet Take 1 tablet (500 mg total) by mouth 3 (three) times daily. 12/28/21   Rodriguez-Southworth, Nettie Elm, PA-C  norethindrone (ORTHO MICRONOR) 0.35 MG tablet Take 1 tablet (0.35 mg total) by mouth daily. Start 4-6 weeks postpartum 07/05/20 07/05/21  Gustavo Lah, CNM  ondansetron (ZOFRAN) 4 MG tablet Take 1 tablet (4 mg total) by mouth every 8 (eight) hours as needed for nausea or vomiting. 12/11/22   Defelice, Para March, NP  Prenatal Vit-Fe Fumarate-FA (PRENATAL MULTIVITAMIN) TABS tablet Take 1 tablet by mouth daily at 12 noon.    [provider]  doxylamine, Sleep, (UNISOM) 25 MG tablet Take 1/2 tab PO q6h prn nausea/vomiting pregnancy related 11/10/19 03/21/20  Eusebio Friendly B, PA-C  ESTARYLLA 0.25-35 MG-MCG tablet TK 1 T PO QD FOR BIRTH CONTROL. 08/25/18 03/21/20  [provider]    Family History Family History  Problem Relation Age of Onset   Diabetes Mother    Diabetes Father     Social History Social History   Tobacco Use   Smoking status: Never   Smokeless tobacco: Never  Vaping Use   Vaping status: Never Used  Substance Use Topics   Alcohol use: Not Currently    Comment: social   Drug use: Never     Allergies   Patient has no known allergies.   Review of Systems Review of Systems  Constitutional:  Negative for fever.  Gastrointestinal:  Negative for abdominal pain, nausea and vomiting.   Genitourinary:  Positive for dysuria, frequency and urgency. Negative for hematuria, vaginal discharge and vaginal pain.  Musculoskeletal:  Positive for back pain.     Physical Exam Triage Vital Signs ED Triage Vitals  Encounter Vitals Group     BP      Systolic BP Percentile      Diastolic BP Percentile      Pulse      Resp      Temp      Temp src      SpO2      Weight      Height      Head Circumference      Peak Flow      Pain Score      Pain Loc      Pain Education      Exclude from Growth Chart    No data found.  Updated Vital Signs BP 111/71 (BP Location: Right Arm)   Pulse (!) 55   Temp 98.2 F (36.8 C) (Oral)   LMP 01/28/2023 (Approximate)   SpO2 100%   Visual Acuity Right Eye Distance:   Left Eye Distance:   Bilateral Distance:    Right Eye Near:   Left Eye Near:    Bilateral Near:     Physical Exam Vitals and nursing note reviewed.  Constitutional:      Appearance: Normal appearance. She is not ill-appearing.  HENT:     Head: Normocephalic and atraumatic.  Cardiovascular:     Rate and Rhythm: Normal rate and regular rhythm.     Pulses: Normal pulses.     Heart sounds: Normal heart sounds. No murmur heard.    No friction rub. No gallop.  Pulmonary:     Effort: Pulmonary effort is normal.     Breath sounds: Normal breath sounds. No wheezing, rhonchi or rales.  Abdominal:     Tenderness: There is no right CVA tenderness or left CVA tenderness.  Skin:    General: Skin is warm and dry.     Capillary Refill: Capillary refill takes less than 2 seconds.  Neurological:     General: No focal deficit present.     Mental Status: She is alert and oriented to person, place, and time.      UC Treatments / Results  Labs (all labs ordered are listed, but only abnormal results are displayed) Labs Reviewed  URINALYSIS, W/ REFLEX TO CULTURE (INFECTION SUSPECTED) - Abnormal; Notable for the following components:      Result Value   APPearance HAZY  (*)    Hgb urine dipstick MODERATE (*)  Leukocytes,Ua SMALL (*)    Bacteria, UA MANY (*)    All other components within normal limits  URINE CULTURE    EKG   Radiology No results found.  Procedures Procedures (including critical care time)  Medications Ordered in UC Medications - No data to display  Initial Impression / Assessment and Plan / UC Course  I have reviewed the triage vital signs and the nursing notes.  Pertinent labs & imaging results that were available during my care of the patient were reviewed by me and considered in my medical decision making (see chart for details).   Patient is a pleasant, nontoxic-appearing 32 year old female presenting for evaluation of UTI symptoms started 3 days ago as outlined HPI above.  She has no CVA tenderness on exam.  Abdomen is soft, flat, and nontender.  She denies any vaginal discharge or itching.  I will order urinalysis to evaluate for the presence of UTI.  Urinalysis has a hazy appearance with moderate hemoglobin a small leukocyte esterase.  Negative for nitrites or protein.  Reflex microscopy shows 21-50 WBCs with 6-10 RBCs, many bacteria, and WBC clumps.  Urine will reflex to culture.  I will discharge patient home with a diagnosis of urinary tract infection and start her on Macrobid 100 mg twice daily for 5 days along with Pyridium 200 mg every 8 hours as needed for urinary discomfort.  Return precautions reviewed.  Work note provided.   Final Clinical Impressions(s) / UC Diagnoses   Final diagnoses:  Lower urinary tract infectious disease     Discharge Instructions      Take the Macrobid twice daily for 5 days with food for treatment of urinary tract infection.  Use the Pyridium every 8 hours as needed for urinary discomfort.  This will turn your urine a bright red-orange.  Increase your oral fluid intake so that you increase your urine production and or flushing your urinary system.  Take an over-the-counter  probiotic, such as Culturelle-Align-Activia, 1 hour after each dose of antibiotic to prevent diarrhea or yeast infections from forming.  We will culture urine and change the antibiotics if necessary.  Return for reevaluation, or see your primary care provider, for any new or worsening symptoms.      ED Prescriptions     Medication Sig Dispense Auth. Provider   nitrofurantoin, macrocrystal-monohydrate, (MACROBID) 100 MG capsule Take 1 capsule (100 mg total) by mouth 2 (two) times daily. 10 capsule Becky Augusta, NP   phenazopyridine (PYRIDIUM) 200 MG tablet Take 1 tablet (200 mg total) by mouth 3 (three) times daily. 6 tablet Becky Augusta, NP      PDMP not reviewed this encounter.   Becky Augusta, NP 02/04/23 1128

## 2023-02-06 LAB — URINE CULTURE: Culture: 20000 — AB

## 2023-03-13 HISTORY — PX: LEEP: SHX91

## 2023-07-09 ENCOUNTER — Ambulatory Visit
Admission: RE | Admit: 2023-07-09 | Discharge: 2023-07-09 | Disposition: A | Discharge status: Home / Self Care | Payer: Self-pay | Source: Ambulatory Visit | Attending: Family Medicine | Admitting: Family Medicine

## 2023-07-09 VITALS — BP 116/75 | HR 61 | Temp 99.4°F | Resp 16

## 2023-07-09 DIAGNOSIS — N390 Urinary tract infection, site not specified: Secondary | ICD-10-CM

## 2023-07-09 DIAGNOSIS — Z3202 Encounter for pregnancy test, result negative: Secondary | ICD-10-CM

## 2023-07-09 LAB — URINALYSIS, MICROSCOPIC (REFLEX)

## 2023-07-09 LAB — URINALYSIS, ROUTINE W REFLEX MICROSCOPIC
Bilirubin Urine: NEGATIVE
Glucose, UA: NEGATIVE mg/dL
Ketones, ur: NEGATIVE mg/dL
Nitrite: NEGATIVE
Protein, ur: NEGATIVE mg/dL
Specific Gravity, Urine: 1.01 (ref 1.005–1.030)
pH: 6.5 (ref 5.0–8.0)

## 2023-07-09 LAB — PREGNANCY, URINE: Preg Test, Ur: NEGATIVE

## 2023-07-09 MED ORDER — CEFTRIAXONE SODIUM 1 G IJ SOLR
1.0000 g | Freq: Once | INTRAMUSCULAR | Status: AC
  Administered 2023-07-09: 1 g via INTRAMUSCULAR

## 2023-07-09 MED ORDER — SULFAMETHOXAZOLE-TRIMETHOPRIM 800-160 MG PO TABS: 1.0000 | ORAL_TABLET | Freq: Two times a day (BID) | ORAL | 0 refills | Status: AC

## 2023-07-09 NOTE — ED Provider Notes (Signed)
 [facility]    CSN: [id number] Arrival date & time: 07/09/23  1515      History   Chief Complaint Chief Complaint  Patient presents with   Urinary Frequency    Possibly uti - Entered by patient   Dysuria   Back Pain    HPI Susan Hanna is a 33 y.o. female presents for dysuria.  Patient reports 3 days of urinary burning, urgency, frequency.  Denies hematuria, fevers, vomiting.  Does endorse some mild nausea and right back pain.  No vaginal discharge or STD concern.  Does have a history of recurrent UTIs but denies history of pyelonephritis.  Has been taking OTC Tylenol  for symptoms.  No other concerns at this time.   Urinary Frequency  Dysuria Back Pain Associated symptoms: dysuria     Past Medical History:  Diagnosis Date   Hypoglycemia    Migraine     Patient Active Problem List   Diagnosis Date Noted   Nonspecific syndrome suggestive of viral illness 12/11/2022   NSVD (normal spontaneous vaginal delivery) 07/04/2020   PPH (postpartum hemorrhage) 07/04/2020   Cervical dysplasia 04/20/2020    Past Surgical History:  Procedure Laterality Date   DENTAL SURGERY      OB History     Gravida  1   Para  1   Term  1   Preterm      AB      Living  1      SAB      IAB      Ectopic      Multiple  0   Live Births  1            Home Medications    Prior to Admission medications   Medication Sig Start Date End Date Taking? Authorizing Provider  sulfamethoxazole-trimethoprim (BACTRIM DS) 800-160 MG tablet Take 1 tablet by mouth 2 (two) times daily for 7 days. 07/09/23 07/16/23 Yes Sonny Poth, Jodi R, NP  acetaminophen  (TYLENOL ) 325 MG tablet Take 2 tablets (650 mg total) by mouth every 4 (four) hours as needed (for pain scale < 4). 07/05/20   Teodora Fell, CNM  docusate sodium  (COLACE) 100 MG capsule Take 1 capsule (100 mg total) by mouth 2 (two) times daily. 07/05/20   Teodora Fell, CNM  Etonogestrel (NEXPLANON ) Inject into  the skin.    [provider]  ferrous sulfate  325 (65 FE) MG tablet Take 1 tablet (325 mg total) by mouth 2 (two) times daily with a meal. 07/05/20   Teodora Fell, CNM  ibuprofen  (ADVIL ) 800 MG tablet Take 1 tablet (800 mg total) by mouth 3 (three) times daily. 12/28/21   Rodriguez-Southworth, Sylvia, PA-C  ipratropium (ATROVENT ) 0.06 % nasal spray Place 2 sprays into both nostrils 4 (four) times daily. 12/21/20   Kent Pear, NP  methocarbamol  (ROBAXIN ) 500 MG tablet Take 1 tablet (500 mg total) by mouth 3 (three) times daily. 12/28/21   Rodriguez-Southworth, Sylvia, PA-C  nitrofurantoin , macrocrystal-monohydrate, (MACROBID ) 100 MG capsule Take 1 capsule (100 mg total) by mouth 2 (two) times daily. 02/04/23   Kent Pear, NP  norethindrone  (ORTHO MICRONOR ) 0.35 MG tablet Take 1 tablet (0.35 mg total) by mouth daily. Start 4-6 weeks postpartum 07/05/20 07/05/21  Teodora Fell, CNM  ondansetron  (ZOFRAN ) 4 MG tablet Take 1 tablet (4 mg total) by mouth every 8 (eight) hours as needed for nausea or vomiting. 12/11/22   Defelice, Eveleen Hinds, NP  phenazopyridine  (PYRIDIUM ) 200 MG tablet  Take 1 tablet (200 mg total) by mouth 3 (three) times daily. 02/04/23   Kent Pear, NP  Prenatal Vit-Fe Fumarate-FA (PRENATAL MULTIVITAMIN) TABS tablet Take 1 tablet by mouth daily at 12 noon.    [provider]  doxylamine , Sleep, (UNISOM ) 25 MG tablet Take 1/2 tab PO q6h prn nausea/vomiting pregnancy related 11/10/19 03/21/20  Nancy Axon B, PA-C  ESTARYLLA 0.25-35 MG-MCG tablet TK 1 T PO QD FOR BIRTH CONTROL. 08/25/18 03/21/20  [provider]    Family History Family History  Problem Relation Age of Onset   Diabetes Mother    Diabetes Father     Social History Social History   Tobacco Use   Smoking status: Never   Smokeless tobacco: Never  Vaping Use   Vaping status: Never Used  Substance Use Topics   Alcohol use: Not Currently    Comment: social   Drug use: Never      Allergies   Patient has no known allergies.   Review of Systems Review of Systems  Genitourinary:  Positive for dysuria and frequency.  Musculoskeletal:  Positive for back pain.     Physical Exam Triage Vital Signs ED Triage Vitals  Encounter Vitals Group     BP 07/09/23 1544 116/75     Systolic BP Percentile --      Diastolic BP Percentile --      Pulse Rate 07/09/23 1544 61     Resp 07/09/23 1544 16     Temp 07/09/23 1544 99.4 F (37.4 C)     Temp Source 07/09/23 1544 Oral     SpO2 07/09/23 1544 96 %     Weight --      Height --      Head Circumference --      Peak Flow --      Pain Score 07/09/23 1542 5     Pain Loc --      Pain Education --      Exclude from Growth Chart --    No data found.  Updated Vital Signs BP 116/75 (BP Location: Right Arm)   Pulse 61   Temp 99.4 F (37.4 C) (Oral)   Resp 16   LMP 06/25/2023 (Approximate)   SpO2 96%   Visual Acuity Right Eye Distance:   Left Eye Distance:   Bilateral Distance:    Right Eye Near:   Left Eye Near:    Bilateral Near:     Physical Exam Vitals and nursing note reviewed.  Constitutional:      General: She is not in acute distress.    Appearance: Normal appearance. She is not ill-appearing.  HENT:     Head: Normocephalic and atraumatic.  Eyes:     Pupils: Pupils are equal, round, and reactive to light.  Cardiovascular:     Rate and Rhythm: Normal rate.  Pulmonary:     Effort: Pulmonary effort is normal.  Abdominal:     Tenderness: There is right CVA tenderness. There is no left CVA tenderness.  Skin:    General: Skin is warm and dry.  Neurological:     General: No focal deficit present.     Mental Status: She is alert and oriented to person, place, and time.  Psychiatric:        Mood and Affect: Mood normal.        Behavior: Behavior normal.      UC Treatments / Results  Labs (all labs ordered are listed, but only abnormal results are  displayed) Labs Reviewed  URINALYSIS,  ROUTINE W REFLEX MICROSCOPIC - Abnormal; Notable for the following components:      Result Value   Hgb urine dipstick MODERATE (*)    Leukocytes,Ua TRACE (*)    All other components within normal limits  URINALYSIS, MICROSCOPIC (REFLEX) - Abnormal; Notable for the following components:   Bacteria, UA MANY (*)    All other components within normal limits  URINE CULTURE  PREGNANCY, URINE   Comprehensive Metabolic Panel Order: [id number] Component Ref Range & Units 3 mo ago  Sodium 135 - 145 mmol/L 141  Potassium 3.4 - 4.8 mmol/L 3.9  Chloride 98 - 107 mmol/L 101  CO2 20.0 - 31.0 mmol/L 26.6  Anion Gap 5 - 14 mmol/L 13  BUN 9 - 23 mg/dL 11  Creatinine 4.13 - 2.44 mg/dL 0.10  BUN/Creatinine Ratio 14  eGFR CKD-EPI (2021) Female >=60 mL/min/1.19m2 >90  Comment: eGFR calculated with CKD-EPI 2021 equation in accordance with [employer] and [employer] of Nephrology Task Force recommendations.  Glucose 70 - 179 mg/dL 99  Calcium  8.7 - 10.4 mg/dL 9.4  Albumin 3.4 - 5.0 g/dL 3.8  Total Protein 5.7 - 8.2 g/dL 7.5  Total Bilirubin 0.3 - 1.2 mg/dL 0.8  AST <=27 U/L 20  ALT 10 - 49 U/L 26  Alkaline Phosphatase 46 - 116 U/L 105  Resulting Agency [facility]   Specimen Collected: 03/20/23 15:00   Performed by: [facility] Last Resulted: 03/20/23 15:46  Received From: [facility]  Result Received: 07/09/23 15:27   EKG   Radiology No results found.  Procedures Procedures (including critical care time)  Medications Ordered in UC Medications  cefTRIAXone (ROCEPHIN) injection 1 g (has no administration in time range)    Initial Impression / Assessment and Plan / UC Course  I have reviewed the triage vital signs and the nursing notes.  Pertinent labs & imaging results that were available during my care of the patient were reviewed by me and considered in my medical decision making (see chart for details).      Reviewed exam and symptoms with patient.  No red flags.  UA positive for UTI.  Concern for Pilo given low-grade fever, flank pain.  Patient given IM ceftriaxone in clinic.  Monitored for 10 minutes after injection with no reaction noted and tolerated well.  Will start Bactrim twice daily for 7 days.  Urine culture and will contact for any positive results.  Encouraged rest fluids and PCP follow-up 2 to 3 days for recheck.  ER precautions reviewed. Final Clinical Impressions(s) / UC Diagnoses   Final diagnoses:  Complicated UTI (urinary tract infection)     Discharge Instructions      The clinic will contact you with results of the urine culture done today if positive.  Start Bactrim twice daily for 7 days.  Lots of rest and fluids.  Please follow-up with your PCP in 2 to 3 days for recheck.  Please go to the ER if you develop any worsening symptoms.  Hope you feel better soon!   ED Prescriptions     Medication Sig Dispense Auth. Provider   sulfamethoxazole-trimethoprim (BACTRIM DS) 800-160 MG tablet Take 1 tablet by mouth 2 (two) times daily for 7 days. 14 tablet Huma Imhoff, Jodi R, NP      PDMP not reviewed this encounter.   Alleen Arbour, NP 07/09/23 [phone]

## 2023-07-09 NOTE — Discharge Instructions (Addendum)
 The clinic will contact you with results of the urine culture done today if positive.  Start Bactrim  twice daily for 7 days.  Lots of rest and fluids.  Please follow-up with your PCP in 2 to 3 days for recheck.  Please go to the ER if you develop any worsening symptoms.  Hope you feel better soon!

## 2023-07-09 NOTE — ED Triage Notes (Signed)
 Pt presents with lower back pain and dysuria x 3 days. Pt has taken Tylenol  for her symptoms.

## 2023-07-11 ENCOUNTER — Ambulatory Visit (HOSPITAL_COMMUNITY): Payer: Self-pay

## 2023-07-11 LAB — URINE CULTURE: Culture: 20000 — AB

## 2023-08-12 ENCOUNTER — Encounter: Payer: Self-pay | Admitting: Family Medicine

## 2023-08-12 ENCOUNTER — Ambulatory Visit (INDEPENDENT_AMBULATORY_CARE_PROVIDER_SITE_OTHER): Admitting: Family Medicine

## 2023-08-12 ENCOUNTER — Other Ambulatory Visit: Payer: Self-pay | Admitting: Family Medicine

## 2023-08-12 VITALS — BP 120/78 | HR 82 | Ht 62.0 in | Wt 157.4 lb

## 2023-08-12 DIAGNOSIS — N926 Irregular menstruation, unspecified: Secondary | ICD-10-CM | POA: Diagnosis not present

## 2023-08-12 DIAGNOSIS — R110 Nausea: Secondary | ICD-10-CM | POA: Diagnosis not present

## 2023-08-12 DIAGNOSIS — D509 Iron deficiency anemia, unspecified: Secondary | ICD-10-CM | POA: Insufficient documentation

## 2023-08-12 MED ORDER — PANTOPRAZOLE SODIUM 40 MG PO TBEC: 40.0000 mg | DELAYED_RELEASE_TABLET | Freq: Every day | ORAL | 1 refills | Status: DC

## 2023-08-12 MED ORDER — ONDANSETRON 8 MG PO TBDP: 8.0000 mg | ORAL_TABLET | Freq: Three times a day (TID) | ORAL | 0 refills | Status: AC | PRN

## 2023-08-12 NOTE — Assessment & Plan Note (Signed)
 Susan Hanna is a 33 year old female who presents with dizziness and fatigue. She was referred by Dr. Greig Public at [facility] for evaluation of bleeding.  Constitutional symptoms - Dizziness and fatigue for a little over a week following a blood draw - Weakness and excessive sleepiness - Fatigue worsened with ADLs - Shortness of breath on exertion  Physical Exam HEENT: Mild conjunctival pallor.  Oropharynx normal. NECK: No cervical or supraclavicular lymphadenopathy. CHEST: Lungs clear to auscultation bilaterally. CARDIOVASCULAR: Positive S1-S2, regular rate and rhythm, no additional heart sounds. ABDOMEN: Normoactive bowel sounds. Abdomen soft, non-tender, non-distended. No hepatosplenomegaly.  Iron deficiency anemia Iron deficiency anemia likely due to prolonged menorrhagia and possible gastrointestinal blood loss. Recent labs show low-normal iron levels. Labs show no bleeding disorder and normal coagulation profile, elevated platelet secretion responses are noted; mildly low iron saturation may benefit from optimization as she is symptomatic. - Refer to hematology for further evaluation and potential iron transfusion. - Switch iron supplement to ferrous gluconate for better gastrointestinal tolerance. - Advise taking iron every other day with vitamin C to enhance absorption. - Avoid calcium , tea, and coffee within two hours of taking iron. - Prescribe pantoprazole to reduce stomach acid and potential gastrointestinal blood loss. - Provide dietary advice to increase iron intake through foods.

## 2023-08-12 NOTE — Assessment & Plan Note (Signed)
 Gastrointestinal symptoms - Nausea attributed to iron supplementation (325 mg iron tablets twice daily with meals)  Nausea Nausea likely secondary to iron supplementation. - Prescribe Zofran  as needed for nausea. - Advise small, frequent meals and avoidance of fatty, greasy, fried, and spicy foods. - Monitor for improvement with pantoprazole and dietary changes.

## 2023-08-12 NOTE — Patient Instructions (Signed)
 VISIT SUMMARY:  During your visit, we discussed your dizziness, fatigue, and prolonged menstrual periods. We identified iron deficiency anemia likely due to prolonged menstrual bleeding and possible gastrointestinal blood loss. We also addressed your nausea related to iron supplementation.  YOUR PLAN:  IRON DEFICIENCY ANEMIA: You have low iron levels likely due to prolonged menstrual bleeding and possible gastrointestinal blood loss. -We will refer you to a hematologist for further evaluation. -Switch your iron supplement to ferrous gluconate for better tolerance. -Take iron every other day with vitamin C to help with absorption. -Avoid calcium , tea, and coffee within two hours of taking iron. -We will prescribe pantoprazole to reduce stomach acid and potential gastrointestinal blood loss. -Increase your iron intake through foods.  MENORRHAGIA: You have prolonged menstrual periods lasting up to three weeks, which is most likely contributing to your iron deficiency anemia. -Continue with your current contraceptive implant. -Discuss with your OBGYN about how to better manage your menstrual bleeding.  NAUSEA: Your nausea is likely due to iron supplementation. -We will prescribe Zofran  to take as needed for nausea. -Eat small, frequent meals and avoid fatty, greasy, fried, and spicy foods. -Monitor for improvement with pantoprazole and dietary changes.

## 2023-08-12 NOTE — Assessment & Plan Note (Signed)
 Menstrual abnormalities - Prolonged menstrual periods lasting up to three weeks - Increased frequency and duration of menses since starting the Nexplanon implant - Initial amenorrhea for three to four months after implant placement, followed by gradual increase in menstrual duration - Increasing weakness associated with prolonged menses  Menorrhagia Menorrhagia with periods lasting up to three weeks, likely contributing to iron deficiency anemia.  - Continue current contraceptive implant. - Discuss with OBGYN about potential switch to regulate menstrual bleeding.

## 2023-08-12 NOTE — Progress Notes (Signed)
 Primary Care / Sports Medicine Office Visit  Patient Information:  Patient ID: Susan Hanna, female DOB: 1990-05-30 Age: 33 y.o. MRN: [id number]   Susan Hanna is a pleasant 33 y.o. female presenting with the following:  Chief Complaint  Patient presents with   Dizziness    Dizziness x 1 week. Patient has noticed over the last week episodes of dizziness when she is walking and sometimes even when she is sitting.     Vitals:   08/12/23 1028  BP: 120/78  Pulse: 82  SpO2: 96%   Vitals:   08/12/23 1028  Weight: 157 lb 6.4 oz (71.4 kg)  Height: 5' 2 (1.575 m)   Body mass index is 28.79 kg/m.  No results found.   Independent interpretation of notes and tests performed by another provider:   None  Procedures performed:   None  Pertinent History, Exam, Impression, and Recommendations:   Problem List Items Addressed This Visit     Iron deficiency anemia - Primary   Susan Hanna is a 33 year old female who presents with dizziness and fatigue. She was referred by Dr. Greig Public at [facility] for evaluation of bleeding.  Constitutional symptoms - Dizziness and fatigue for a little over a week following a blood draw - Weakness and excessive sleepiness - Fatigue worsened with ADLs - Shortness of breath on exertion  Physical Exam HEENT: Mild conjunctival pallor.  Oropharynx normal. NECK: No cervical or supraclavicular lymphadenopathy. CHEST: Lungs clear to auscultation bilaterally. CARDIOVASCULAR: Positive S1-S2, regular rate and rhythm, no additional heart sounds. ABDOMEN: Normoactive bowel sounds. Abdomen soft, non-tender, non-distended. No hepatosplenomegaly.  Iron deficiency anemia Iron deficiency anemia likely due to prolonged menorrhagia and possible gastrointestinal blood loss. Recent labs show low-normal iron levels. Labs show no bleeding disorder and normal coagulation profile, elevated platelet secretion responses  are noted; mildly low iron saturation may benefit from optimization as she is symptomatic. - Refer to hematology for further evaluation and potential iron transfusion. - Switch iron supplement to ferrous gluconate for better gastrointestinal tolerance. - Advise taking iron every other day with vitamin C to enhance absorption. - Avoid calcium , tea, and coffee within two hours of taking iron. - Prescribe pantoprazole to reduce stomach acid and potential gastrointestinal blood loss. - Provide dietary advice to increase iron intake through foods.      Relevant Orders   Ambulatory referral to Hematology / Oncology   Irregular menstrual bleeding   Menstrual abnormalities - Prolonged menstrual periods lasting up to three weeks - Increased frequency and duration of menses since starting the Nexplanon implant - Initial amenorrhea for three to four months after implant placement, followed by gradual increase in menstrual duration - Increasing weakness associated with prolonged menses  Menorrhagia Menorrhagia with periods lasting up to three weeks, likely contributing to iron deficiency anemia.  - Continue current contraceptive implant. - Discuss with OBGYN about potential switch to regulate menstrual bleeding.       Nausea   Gastrointestinal symptoms - Nausea attributed to iron supplementation (325 mg iron tablets twice daily with meals)  Nausea Nausea likely secondary to iron supplementation. - Prescribe Zofran  as needed for nausea. - Advise small, frequent meals and avoidance of fatty, greasy, fried, and spicy foods. - Monitor for improvement with pantoprazole and dietary changes.        Orders & Medications Medications:  Meds ordered this encounter  Medications   pantoprazole (PROTONIX) 40 MG tablet    Sig: Take 1 tablet (40  mg total) by mouth daily.    Dispense:  30 tablet    Refill:  1   ondansetron  (ZOFRAN -ODT) 8 MG disintegrating tablet    Sig: Take 1 tablet (8 mg total)  by mouth every 8 (eight) hours as needed for nausea.    Dispense:  20 tablet    Refill:  0   Orders Placed This Encounter  Procedures   Ambulatory referral to Hematology / Oncology     Return in about 4 weeks (around 09/09/2023).     Selinda JINNY Ku, MD, [facility]   Primary Care Sports Medicine Primary Care and Sports Medicine at [facility]

## 2023-08-14 NOTE — Telephone Encounter (Signed)
 Requested medication (s) are due for refill today -no  Requested medication (s) are on the active medication list -yes  Future visit scheduled -yes  Last refill: 08/12/23 #30 1RF  Notes to clinic: new start Rx- does not include 90 day supply- requires changes to original Rx - sent for review   Requested Prescriptions  Pending Prescriptions Disp Refills   pantoprazole (PROTONIX) 40 MG tablet [Pharmacy Med Name: PANTOPRAZOLE 40MG  TABLETS] 90 tablet     Sig: TAKE 1 TABLET(40 MG) BY MOUTH DAILY     Gastroenterology: Proton Pump Inhibitors Failed - 08/14/2023 11:52 AM      Failed - Valid encounter within last 12 months    Recent Outpatient Visits           2 days ago Iron deficiency anemia, unspecified iron deficiency anemia type   [facility] Alvia, Selinda PARAS, MD                 Requested Prescriptions  Pending Prescriptions Disp Refills   pantoprazole (PROTONIX) 40 MG tablet [Pharmacy Med Name: PANTOPRAZOLE 40MG  TABLETS] 90 tablet     Sig: TAKE 1 TABLET(40 MG) BY MOUTH DAILY     Gastroenterology: Proton Pump Inhibitors Failed - 08/14/2023 11:52 AM      Failed - Valid encounter within last 12 months    Recent Outpatient Visits           2 days ago Iron deficiency anemia, unspecified iron deficiency anemia type   [facility] Primary Care & [facility] Medicine at [facility], Jason J, MD

## 2023-09-03 ENCOUNTER — Inpatient Hospital Stay

## 2023-09-03 ENCOUNTER — Inpatient Hospital Stay: Admitting: Oncology

## 2023-09-10 ENCOUNTER — Encounter: Admitting: Student

## 2023-09-11 ENCOUNTER — Ambulatory Visit: Payer: Self-pay

## 2023-09-19 ENCOUNTER — Encounter: Payer: Self-pay | Admitting: Oncology

## 2023-09-19 ENCOUNTER — Inpatient Hospital Stay

## 2023-09-19 ENCOUNTER — Ambulatory Visit: Payer: Self-pay | Admitting: Oncology

## 2023-09-19 ENCOUNTER — Inpatient Hospital Stay: Attending: Oncology | Admitting: Oncology

## 2023-09-19 VITALS — BP 117/74 | HR 67 | Temp 98.2°F | Resp 18 | Wt 160.0 lb

## 2023-09-19 DIAGNOSIS — Z79899 Other long term (current) drug therapy: Secondary | ICD-10-CM | POA: Diagnosis not present

## 2023-09-19 DIAGNOSIS — N871 Moderate cervical dysplasia: Secondary | ICD-10-CM | POA: Insufficient documentation

## 2023-09-19 DIAGNOSIS — E611 Iron deficiency: Secondary | ICD-10-CM | POA: Insufficient documentation

## 2023-09-19 LAB — IRON AND TIBC
Iron: 64 ug/dL (ref 28–170)
Saturation Ratios: 18 % (ref 10.4–31.8)
TIBC: 356 ug/dL (ref 250–450)
UIBC: 292 ug/dL

## 2023-09-19 LAB — CBC WITH DIFFERENTIAL/PLATELET
Abs Immature Granulocytes: 0.03 K/uL (ref 0.00–0.07)
Basophils Absolute: 0 K/uL (ref 0.0–0.1)
Basophils Relative: 0 %
Eosinophils Absolute: 0.1 K/uL (ref 0.0–0.5)
Eosinophils Relative: 2 %
HCT: 41.2 % (ref 36.0–46.0)
Hemoglobin: 13.8 g/dL (ref 12.0–15.0)
Immature Granulocytes: 0 %
Lymphocytes Relative: 32 %
Lymphs Abs: 2.9 K/uL (ref 0.7–4.0)
MCH: 30.1 pg (ref 26.0–34.0)
MCHC: 33.5 g/dL (ref 30.0–36.0)
MCV: 89.8 fL (ref 80.0–100.0)
Monocytes Absolute: 0.6 K/uL (ref 0.1–1.0)
Monocytes Relative: 6 %
Neutro Abs: 5.3 K/uL (ref 1.7–7.7)
Neutrophils Relative %: 60 %
Platelets: 303 K/uL (ref 150–400)
RBC: 4.59 MIL/uL (ref 3.87–5.11)
RDW: 13.8 % (ref 11.5–15.5)
WBC: 9 K/uL (ref 4.0–10.5)
nRBC: 0 % (ref 0.0–0.2)

## 2023-09-19 LAB — RETIC PANEL
Immature Retic Fract: 6.5 % (ref 2.3–15.9)
RBC.: 4.52 MIL/uL (ref 3.87–5.11)
Retic Count, Absolute: 77.7 K/uL (ref 19.0–186.0)
Retic Ct Pct: 1.7 % (ref 0.4–3.1)
Reticulocyte Hemoglobin: 33.4 pg (ref >27.9)

## 2023-09-19 LAB — FERRITIN: Ferritin: 30 ng/mL (ref 11–307)

## 2023-09-19 NOTE — Progress Notes (Signed)
 Hematology/Oncology Consult note [phone] [phone]        REFERRING PROVIDER: Alvia Selinda PARAS, MD   CHIEF COMPLAINTS/REASON FOR VISIT:  Evaluation of anemia   ASSESSMENT & PLAN:   Iron deficiency I recommend patient to repeat CBC and iron panel today. Lab Results  Component Value Date   HGB 13.8 09/19/2023   TIBC 356 09/19/2023   IRONPCTSAT 18 09/19/2023   FERRITIN 30 09/19/2023    Today's blood work shows normalization of hemoglobin and iron panel.  No significant iron deficiency.  I recommend patient to stop oral iron supplementation.  No need for IV Venofer.  She can follow-up in 6 months  CIN II (cervical intraepithelial neoplasia II) Recommend patient follow-up with [facility]   Orders Placed This Encounter  Procedures   Iron and TIBC    Standing Status:   Future    Number of Occurrences:   1    Expected Date:   09/19/2023    Expiration Date:   12/18/2023   Ferritin    Standing Status:   Future    Number of Occurrences:   1    Expected Date:   09/19/2023    Expiration Date:   12/18/2023   CBC with Differential/Platelet    Standing Status:   Future    Number of Occurrences:   1    Expected Date:   09/19/2023    Expiration Date:   12/18/2023   Retic Panel    Standing Status:   Future    Number of Occurrences:   1    Expected Date:   09/19/2023    Expiration Date:   12/18/2023    All questions were answered. The patient knows to call the clinic with any problems, questions or concerns.  Zelphia Cap, MD, PhD [facility] Hematology Oncology 09/19/2023   HISTORY OF PRESENTING ILLNESS:   Susan Hanna is a  33 y.o.  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  Patient was seen by [facility]  hematology on 08/01/2023 and had workup done. Patient had a negative workup including normal factor IX, XI, fibrinogen, platelet aggregation, platelet function, von Willebrand panel.   She was found to have iron deficiency anemia and was recommended to start oral iron supplementation.   she developed nausea and dizziness, loss of hearing.  For about 2 weeks. Symptoms were felt to be secondary to vertigo and are improved after taking meclizine. She did not tolerate oral supplementation due to GI side effects.  Patient was referred to establish care with hematology for further management.   MEDICAL HISTORY:  Past Medical History:  Diagnosis Date   Hypoglycemia    Migraine    NSVD (normal spontaneous vaginal delivery) 07/04/2020    SURGICAL HISTORY: Past Surgical History:  Procedure Laterality Date   DENTAL SURGERY     LEEP  03/13/2023    SOCIAL HISTORY: Social History   Socioeconomic History   Marital status: Single    Spouse name: Not on file   Number of children: Not on file   Years of education: Not on file   Highest education level: Not on file  Occupational History   Not on file  Tobacco Use   Smoking status: Never   Smokeless tobacco: Never  Vaping Use   Vaping status: Never Used  Substance and Sexual Activity   Alcohol use: Not Currently    Comment: social   Drug use: Never   Sexual activity: Not on file  Other Topics Concern   Not on file  Social History Narrative   Not on file   Social Drivers of Health   Financial Resource Strain: Low Risk  (09/19/2023)   Overall Financial Resource Strain (CARDIA)    Difficulty of Paying Living Expenses: Not very hard  Food Insecurity: No Food Insecurity (09/19/2023)   Hunger Vital Sign    Worried About Running Out of Food in the Last Year: Never true    Ran Out of Food in the Last Year: Never true  Transportation Needs: No Transportation Needs (09/19/2023)   PRAPARE - Administrator, Civil Service (Medical):  No    Lack of Transportation (Non-Medical): No  Physical Activity: Not on file  Stress: No Stress Concern Present (09/19/2023)   [employer] of Occupational Health - Occupational Stress Questionnaire    Feeling of Stress: Only a little  Social Connections: Not on file  Intimate Partner Violence: Not At Risk (09/19/2023)   Humiliation, Afraid, Rape, and Kick questionnaire    Fear of Current or Ex-Partner: No    Emotionally Abused: No    Physically Abused: No    Sexually Abused: No    FAMILY HISTORY: Family History  Problem Relation Age of Onset   Cancer Mother    Diabetes Mother    Stroke Father    Diabetes Father     ALLERGIES:  has no known allergies.  MEDICATIONS:  Current Outpatient Medications  Medication Sig Dispense Refill   acetaminophen  (TYLENOL ) 325 MG tablet Take 2 tablets (650 mg total) by mouth every 4 (four) hours as needed (for pain scale < 4).     Etonogestrel (NEXPLANON Garrett) Inject into the skin.     ipratropium (ATROVENT ) 0.06 % nasal spray Place 2 sprays into both nostrils 4 (four) times daily. 15 mL 12   meclizine (ANTIVERT) 25 MG tablet Take 25 mg by mouth every 6 (six) hours as needed.     ondansetron  (ZOFRAN -ODT) 8 MG disintegrating tablet Take 1 tablet (8 mg total) by mouth every 8 (eight) hours as needed for nausea. 20 tablet 0   pantoprazole  (PROTONIX ) 40 MG tablet TAKE 1 TABLET(40 MG) BY MOUTH DAILY 90 tablet 0   ferrous sulfate  325 (65 FE) MG tablet Take 1 tablet (325 mg total) by mouth 2 (two) times daily with a meal. (Patient not taking: Reported on 09/19/2023) 60 tablet 3   No current facility-administered medications for this visit.    Review of Systems  Constitutional:  Negative for appetite change, chills, fatigue and fever.  HENT:   Negative for hearing loss and voice change.   Eyes:  Negative for eye problems.  Respiratory:  Negative for chest tightness and cough.   Cardiovascular:  Negative for chest pain.  Gastrointestinal:  Negative  for abdominal distention, abdominal pain and blood in stool.  Endocrine: Negative for hot flashes.  Genitourinary:  Negative for difficulty urinating and frequency.   Musculoskeletal:  Negative for arthralgias.  Skin:  Negative for itching and rash.  Neurological:  Negative for extremity weakness.  Hematological:  Negative for adenopathy.  Psychiatric/Behavioral:  Negative for confusion.    PHYSICAL EXAMINATION:  Vitals:   09/19/23 1511  BP: 117/74  Pulse: 67  Resp: 18  Temp: 98.2 F (36.8 C)  SpO2: 97%   Filed Weights   09/19/23 1511  Weight: 160 lb (72.6 kg)    Physical Exam Constitutional:      General: She is not in acute distress. HENT:     Head: Normocephalic and atraumatic.  Eyes:     General: No scleral icterus. Cardiovascular:     Rate and Rhythm: Normal rate and regular rhythm.     Heart sounds: Normal heart sounds.  Pulmonary:     Effort: Pulmonary effort is normal. No respiratory distress.     Breath sounds: No wheezing.  Abdominal:     General: Bowel sounds are normal. There is no distension.     Palpations: Abdomen is soft.  Musculoskeletal:        General: No deformity. Normal range of motion.     Cervical back: Normal range of motion and neck supple.  Skin:    Findings: No erythema or rash.  Neurological:     Mental Status: She is alert and oriented to person, place, and time. Mental status is at baseline.  Psychiatric:        Mood and Affect: Mood normal.     LABORATORY DATA:  I have reviewed the data as listed    Latest Ref Rng & Units 09/19/2023    3:51 PM 07/05/2020    4:52 AM 07/04/2020    7:28 AM  CBC  WBC 4.0 - 10.5 K/uL 9.0  11.7  13.1   Hemoglobin 12.0 - 15.0 g/dL 86.1  9.7  86.3   Hematocrit 36.0 - 46.0 % 41.2  28.1  39.8   Platelets 150 - 400 K/uL 303  231  271        No data to display            RADIOGRAPHIC STUDIES: I have personally reviewed the radiological images as listed and agreed with the findings in the  report. No results found.

## 2023-09-19 NOTE — Assessment & Plan Note (Signed)
 Recommend patient follow-up with [facility]

## 2023-09-19 NOTE — Assessment & Plan Note (Signed)
 I recommend patient to repeat CBC and iron panel today. Lab Results  Component Value Date   HGB 13.8 09/19/2023   TIBC 356 09/19/2023   IRONPCTSAT 18 09/19/2023   FERRITIN 30 09/19/2023    Today's blood work shows normalization of hemoglobin and iron panel.  No significant iron deficiency.  I recommend patient to stop oral iron supplementation.  No need for IV Venofer.  She can follow-up in 6 months

## 2023-09-20 NOTE — Telephone Encounter (Signed)
 Called patient and informed her that her blood work showed normal iron and hemoglobin. Informed her that Dr. Babara recommends to stop oral iron supplementation and no need for IV Venofer treatments.  Also recommend patient to follow-up in 6 months with lab prior to MD. I will have Rosina schedule and notify patient of appointment date and time.

## 2023-09-20 NOTE — Telephone Encounter (Signed)
-----   Message from Zelphia Cap sent at 09/19/2023  9:25 PM EDT ----- Please let patient know that her blood work showed normal iron and hemoglobin. Recommend patient to stop oral iron supplementation.  No need for IV Venofer treatments.  Recommend patient to follow-up in 6 months with lab prior to MD.  Blood works ordered.  Thank you ----- Message ----- From: Rebecka, Lab In [place] Sent: 09/19/2023   4:03 PM EDT To: Zelphia Cap, MD

## 2023-10-01 ENCOUNTER — Encounter: Admitting: Student

## 2024-02-27 ENCOUNTER — Ambulatory Visit
Admission: RE | Admit: 2024-02-27 | Discharge: 2024-02-27 | Disposition: A | Discharge status: Home / Self Care | Attending: Family Medicine | Admitting: Family Medicine

## 2024-02-27 VITALS — BP 117/74 | HR 61 | Temp 98.7°F | Resp 18 | Wt 147.0 lb

## 2024-02-27 DIAGNOSIS — N390 Urinary tract infection, site not specified: Secondary | ICD-10-CM

## 2024-02-27 DIAGNOSIS — R300 Dysuria: Secondary | ICD-10-CM | POA: Diagnosis not present

## 2024-02-27 LAB — POCT URINE DIPSTICK
Bilirubin, UA: NEGATIVE
Glucose, UA: NEGATIVE mg/dL
Ketones, POC UA: NEGATIVE mg/dL
Nitrite, UA: NEGATIVE
Protein Ur, POC: NEGATIVE mg/dL
Spec Grav, UA: 1.01
Urobilinogen, UA: 0.2 U/dL
pH, UA: 7

## 2024-02-27 MED ORDER — NITROFURANTOIN MONOHYD MACRO 100 MG PO CAPS: 100.0000 mg | ORAL_CAPSULE | Freq: Two times a day (BID) | ORAL | 0 refills | Status: AC

## 2024-02-27 NOTE — ED Provider Notes (Signed)
 " [facility]    CSN: [id number] Arrival date & time: 02/27/24  1750      History   Chief Complaint Chief Complaint  Patient presents with   Urinary Frequency    Burning sensation when urinating, back pain - Entered by patient   Back Pain   Dysuria     HPI HPI Susan Hanna is a 34 y.o. female.    Susan Hanna presents for 2 days of dysuria, cloudy urine and lower back pain.  Tried motrin  prior to arrival.  Has  not had any antibiotics in last 30 days.   Denies known STI exposure.  Susan Hanna does not use condoms regularly. She is  not currently pregnant.  Patient's last menstrual period was 02/11/2024 (exact date).  Has Nexplanon.    - Abnormal vaginal discharge: no - vaginal odor: no - vaginal bleeding: no - Dysuria: yes  - Hematuria: no - Urinary urgency: yes  - Urinary frequency: yes   - Fever: no - Abdominal pain: no - Pelvic pain: yes  - Rash/Skin lesions/mouth ulcers: no - Nausea: no  - Vomiting: no  - Back Pain: yes        Past Medical History:  Diagnosis Date   Hypoglycemia    Migraine    NSVD (normal spontaneous vaginal delivery) 07/04/2020    Patient Active Problem List   Diagnosis Date Noted   Iron deficiency 09/19/2023   CIN II (cervical intraepithelial neoplasia II) 09/19/2023   Iron deficiency anemia 08/12/2023   Irregular menstrual bleeding 08/12/2023   Nausea 08/12/2023   Cervical dysplasia 04/20/2020    Past Surgical History:  Procedure Laterality Date   DENTAL SURGERY     LEEP  03/13/2023    OB History     Gravida  1   Para  1   Term  1   Preterm      AB      Living  1      SAB      IAB      Ectopic      Multiple  0   Live Births  1            Home Medications    Prior to Admission medications  Medication Sig Start Date End Date Taking? Authorizing Provider  nitrofurantoin , macrocrystal-monohydrate, (MACROBID ) 100 MG capsule Take 1 capsule (100 mg total) by mouth 2  (two) times daily. 02/27/24  Yes Lucille Witts, DO  acetaminophen  (TYLENOL ) 325 MG tablet Take 2 tablets (650 mg total) by mouth every 4 (four) hours as needed (for pain scale < 4). 07/05/20   Vernel Therisa HERO, CNM  Etonogestrel (NEXPLANON Melvin) Inject into the skin.    [provider]  ferrous sulfate  325 (65 FE) MG tablet Take 1 tablet (325 mg total) by mouth 2 (two) times daily with a meal. Patient not taking: Reported on 09/19/2023 07/05/20   Vernel Therisa HERO, CNM  ipratropium (ATROVENT ) 0.06 % nasal spray Place 2 sprays into both nostrils 4 (four) times daily. 12/21/20   Bernardino Ditch, NP  meclizine (ANTIVERT) 25 MG tablet Take 25 mg by mouth every 6 (six) hours as needed. 08/28/23   [provider]  ondansetron  (ZOFRAN -ODT) 8 MG disintegrating tablet Take 1 tablet (8 mg total) by mouth every 8 (eight) hours as needed for nausea. 08/12/23   Matthews, Jason J, MD  pantoprazole  (PROTONIX ) 40 MG tablet TAKE 1 TABLET(40 MG) BY MOUTH DAILY 08/14/23   Matthews, Jason J, MD  doxylamine , Sleep, (UNISOM ) 25 MG tablet Take 1/2 tab PO q6h prn nausea/vomiting pregnancy related 11/10/19 03/21/20  Arvis Huxley B, PA-C  ESTARYLLA 0.25-35 MG-MCG tablet TK 1 T PO QD FOR BIRTH CONTROL. 08/25/18 03/21/20  [provider]    Family History Family History  Problem Relation Age of Onset   Cancer Mother    Diabetes Mother    Stroke Father    Diabetes Father     Social History Social History[1]   Allergies   Patient has no known allergies.   Review of Systems Review of Systems: :negative unless otherwise stated in HPI.      Physical Exam Triage Vital Signs ED Triage Vitals  Encounter Vitals Group     BP 02/27/24 1836 117/74     Girls Systolic BP Percentile --      Girls Diastolic BP Percentile --      Boys Systolic BP Percentile --      Boys Diastolic BP Percentile --      Pulse Rate 02/27/24 1836 61     Resp 02/27/24 1836 18     Temp 02/27/24 1836 98.7 F (37.1 C)     Temp Source  02/27/24 1836 Oral     SpO2 02/27/24 1836 98 %     Weight 02/27/24 1835 147 lb (66.7 kg)     Height --      Head Circumference --      Peak Flow --      Pain Score 02/27/24 1835 5     Pain Loc --      Pain Education --      Exclude from Growth Chart --    No data found.  Updated Vital Signs BP 117/74 (BP Location: Right Arm)   Pulse 61   Temp 98.7 F (37.1 C) (Oral)   Resp 18   Wt 66.7 kg   LMP 02/11/2024 (Exact Date)   SpO2 98%   BMI 26.89 kg/m   Visual Acuity Right Eye Distance:   Left Eye Distance:   Bilateral Distance:    Right Eye Near:   Left Eye Near:    Bilateral Near:     Physical Exam GEN: well appearing female in no acute distress  CVS: well perfused  RESP: speaking in full sentences without pause  ABD: soft, non-tender, non-distended, no palpable masses, no CVA tenderness bilaterally   SKIN: warm, dry   UC Treatments / Results  Labs (all labs ordered are listed, but only abnormal results are displayed) Labs Reviewed  POCT URINE DIPSTICK - Abnormal; Notable for the following components:      Result Value   Clarity, UA cloudy (*)    Blood, UA moderate (*)    Leukocytes, UA Moderate (2+) (*)    All other components within normal limits    EKG   Radiology No results found.  Procedures Procedures (including critical care time)  Medications Ordered in UC Medications - No data to display  Initial Impression / Assessment and Plan / UC Course  I have reviewed the triage vital signs and the nursing notes.  Pertinent labs & imaging results that were available during my care of the patient were reviewed by me and considered in my medical decision making (see chart for details).        Acute cystitis:  Patient is a 34 y.o. female  who presents for 2 days of low back pain, dysuria and urinary frequency.  Overall patient is well-appearing and afebrile.  Vital  signs stable.   POC urine dipstick consistent with acute cystitis.   Hematuria not  present but no  microscopy available.   Treat with Macrobid  2 times daily for 5 days. Serum creatine was normal in Feb 2025.  Urine culture obtained.  Follow-up sensitivities and change antibiotics, if needed.   Return precautions including abdominal pain, fever, chills, nausea, or vomiting given. Follow-up,  if symptoms not improving or getting worse. Discussed MDM, treatment plan and plan for follow-up with patient who agrees with plan.        Final Clinical Impressions(s) / UC Diagnoses   Final diagnoses:  Dysuria     Discharge Instructions      You have a urinary tract infection. I sent your urine for culture to be sure the antibiotic prescribed will treat your infection. Someone may call you to change antibiotics. Stop by the pharmacy to pick up your prescriptions.  Follow up with your primary care provider or return to the urgent care      ED Prescriptions     Medication Sig Dispense Auth. Provider   nitrofurantoin , macrocrystal-monohydrate, (MACROBID ) 100 MG capsule Take 1 capsule (100 mg total) by mouth 2 (two) times daily. 10 capsule Susan Ailey, DO      PDMP not reviewed this encounter.     [1]  Social History Tobacco Use   Smoking status: Never   Smokeless tobacco: Never  Vaping Use   Vaping status: Never Used  Substance Use Topics   Alcohol use: Not Currently    Comment: social   Drug use: Never     Susan Calender, DO 02/27/24 1850  "

## 2024-02-27 NOTE — Discharge Instructions (Addendum)
 You have a urinary tract infection. I sent your urine for culture to be sure the antibiotic prescribed will treat your infection. Someone may call you to change antibiotics. Stop by the pharmacy to pick up your prescriptions.  Follow up with your primary care provider or return to the urgent care

## 2024-02-27 NOTE — ED Triage Notes (Signed)
 Sx x 2 days  Dysuria Back pain  Urinary frequency

## 2024-02-29 LAB — URINE CULTURE: Culture: 50000 — AB

## 2024-03-02 ENCOUNTER — Ambulatory Visit (HOSPITAL_COMMUNITY): Payer: Self-pay

## 2024-03-26 ENCOUNTER — Ambulatory Visit: Admitting: Oncology

## 2024-03-26 ENCOUNTER — Other Ambulatory Visit
